# Patient Record
Sex: Male | Born: 1980 | Hispanic: Yes | Marital: Single | Smoking: Never smoker
Health system: Southern US, Community
[De-identification: ages and names within clinical notes are randomized; demographics above are authoritative.]

---

## 2010-05-06 ENCOUNTER — Emergency Department: Payer: Self-pay | Admitting: Emergency Medicine

## 2015-05-22 ENCOUNTER — Emergency Department: Payer: Self-pay

## 2015-05-22 ENCOUNTER — Emergency Department
Admission: EM | Admit: 2015-05-22 | Discharge: 2015-05-22 | Disposition: A | Discharge status: Home / Self Care | Payer: Self-pay | Attending: Emergency Medicine | Admitting: Emergency Medicine

## 2015-05-22 ENCOUNTER — Encounter: Payer: Self-pay | Admitting: *Deleted

## 2015-05-22 ENCOUNTER — Other Ambulatory Visit: Payer: Self-pay

## 2015-05-22 DIAGNOSIS — S20219A Contusion of unspecified front wall of thorax, initial encounter: Secondary | ICD-10-CM | POA: Insufficient documentation

## 2015-05-22 DIAGNOSIS — S161XXA Strain of muscle, fascia and tendon at neck level, initial encounter: Secondary | ICD-10-CM | POA: Insufficient documentation

## 2015-05-22 DIAGNOSIS — Y998 Other external cause status: Secondary | ICD-10-CM | POA: Insufficient documentation

## 2015-05-22 DIAGNOSIS — Y9389 Activity, other specified: Secondary | ICD-10-CM | POA: Insufficient documentation

## 2015-05-22 DIAGNOSIS — Y92410 Unspecified street and highway as the place of occurrence of the external cause: Secondary | ICD-10-CM | POA: Insufficient documentation

## 2015-05-22 MED ORDER — IBUPROFEN 800 MG PO TABS: 800.0000 mg | ORAL_TABLET | Freq: Three times a day (TID) | ORAL | Status: DC | PRN

## 2015-05-22 MED ORDER — OXYCODONE-ACETAMINOPHEN 5-325 MG PO TABS: 1.0000 | ORAL_TABLET | Freq: Four times a day (QID) | ORAL | Status: DC | PRN

## 2015-05-22 MED ORDER — IBUPROFEN 800 MG PO TABS
800.0000 mg | ORAL_TABLET | Freq: Once | ORAL | Status: AC
  Administered 2015-05-22: 800 mg via ORAL
  Filled 2015-05-22: qty 1

## 2015-05-22 MED ORDER — CYCLOBENZAPRINE HCL 10 MG PO TABS: 10.0000 mg | ORAL_TABLET | Freq: Three times a day (TID) | ORAL | Status: DC | PRN

## 2015-05-22 MED ORDER — OXYCODONE-ACETAMINOPHEN 5-325 MG PO TABS
1.0000 | ORAL_TABLET | Freq: Once | ORAL | Status: AC
  Administered 2015-05-22: 1 via ORAL
  Filled 2015-05-22: qty 1

## 2015-05-22 NOTE — ED Notes (Signed)
Patient transported to X-ray via wheelchair 
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
## 2018-09-04 ENCOUNTER — Emergency Department: Payer: Self-pay

## 2018-09-04 ENCOUNTER — Encounter: Payer: Self-pay | Admitting: Emergency Medicine

## 2018-09-04 ENCOUNTER — Other Ambulatory Visit: Payer: Self-pay

## 2018-09-04 ENCOUNTER — Emergency Department
Admission: EM | Admit: 2018-09-04 | Discharge: 2018-09-04 | Disposition: A | Discharge status: Home / Self Care | Payer: Self-pay | Attending: Emergency Medicine | Admitting: Emergency Medicine

## 2018-09-04 DIAGNOSIS — R109 Unspecified abdominal pain: Secondary | ICD-10-CM

## 2018-09-04 DIAGNOSIS — N201 Calculus of ureter: Secondary | ICD-10-CM | POA: Insufficient documentation

## 2018-09-04 LAB — CBC
HCT: 49.6 % (ref 39.0–52.0)
HEMOGLOBIN: 16.8 g/dL (ref 13.0–17.0)
MCH: 30.8 pg (ref 26.0–34.0)
MCHC: 33.9 g/dL (ref 30.0–36.0)
MCV: 91 fL (ref 80.0–100.0)
PLATELETS: 243 10*3/uL (ref 150–400)
RBC: 5.45 MIL/uL (ref 4.22–5.81)
RDW: 12.6 % (ref 11.5–15.5)
WBC: 6.7 10*3/uL (ref 4.0–10.5)
nRBC: 0 % (ref 0.0–0.2)

## 2018-09-04 LAB — URINALYSIS, COMPLETE (UACMP) WITH MICROSCOPIC
BACTERIA UA: NONE SEEN
Bilirubin Urine: NEGATIVE
KETONES UR: NEGATIVE mg/dL
LEUKOCYTES UA: NEGATIVE
NITRITE: NEGATIVE
Protein, ur: 100 mg/dL — AB
RBC / HPF: >50 RBC/hpf — ABNORMAL HIGH (ref 0–5)
Specific Gravity, Urine: 1.036 — ABNORMAL HIGH (ref 1.005–1.030)
Squamous Epithelial / LPF: NONE SEEN (ref 0–5)
pH: 5 (ref 5.0–8.0)

## 2018-09-04 LAB — BASIC METABOLIC PANEL
Anion gap: 7 (ref 5–15)
BUN: 15 mg/dL (ref 6–20)
CALCIUM: 8.9 mg/dL (ref 8.9–10.3)
CO2: 25 mmol/L (ref 22–32)
Chloride: 100 mmol/L (ref 98–111)
Creatinine, Ser: 0.7 mg/dL (ref 0.61–1.24)
GFR calc Af Amer: >60 mL/min (ref >60)
GFR calc non Af Amer: >60 mL/min (ref >60)
GLUCOSE: 240 mg/dL — AB (ref 70–99)
Potassium: 3.8 mmol/L (ref 3.5–5.1)
Sodium: 132 mmol/L — ABNORMAL LOW (ref 135–145)

## 2018-09-04 MED ORDER — NAPROXEN 500 MG PO TABS: 500.0000 mg | ORAL_TABLET | Freq: Two times a day (BID) | ORAL | 0 refills | Status: DC

## 2018-09-04 MED ORDER — TAMSULOSIN HCL 0.4 MG PO CAPS: 0.4000 mg | ORAL_CAPSULE | Freq: Every day | ORAL | 0 refills | Status: DC

## 2018-09-04 MED ORDER — TAMSULOSIN HCL 0.4 MG PO CAPS
0.4000 mg | ORAL_CAPSULE | ORAL | Status: AC
  Administered 2018-09-04: 0.4 mg via ORAL
  Filled 2018-09-04: qty 1

## 2018-09-04 MED ORDER — KETOROLAC TROMETHAMINE 60 MG/2ML IM SOLN
15.0000 mg | Freq: Once | INTRAMUSCULAR | Status: AC
  Administered 2018-09-04: 15 mg via INTRAMUSCULAR
  Filled 2018-09-04: qty 2

## 2018-09-04 MED ORDER — OXYCODONE-ACETAMINOPHEN 5-325 MG PO TABS: 1.0000 | ORAL_TABLET | Freq: Four times a day (QID) | ORAL | 0 refills | Status: AC | PRN

## 2018-09-04 NOTE — Discharge Instructions (Addendum)
Your CT scan shows 2 kidney stones that are in the left ureter just outside your bladder.  This is causing all of your symptoms.  These are small enough to pass on their own.  Take Flomax and naproxen to control the pain.  Percocet can be helpful for episodes of severe pain, but you should avoid taking this medicine when possible.

## 2018-09-04 NOTE — ED Provider Notes (Signed)
[facility] Emergency Department Provider Note  ____________________________________________  Time seen: Approximately 12:54 PM  I have reviewed the triage vital signs and the nursing notes.   HISTORY  Chief Complaint Back Pain    HPI Jesse Maynard is a 37 y.o. male with no significant past medical history who complains of left flank pain for the past 2 days, waxing and waning, no aggravating or alleviating factors, radiating around to his left lower quadrant and left testicle.  Denies any trauma.  No vomiting or fevers.  Pain is severe.      History reviewed. No pertinent past medical history.   There are no active problems to display for this patient.    History reviewed. No pertinent surgical history.   Prior to Admission medications   Medication Sig Start Date End Date Taking? Authorizing Provider  naproxen (NAPROSYN) 500 MG tablet Take 1 tablet (500 mg total) by mouth 2 (two) times daily with a meal. 09/04/18   Sharman CheekStafford, Rosibel Giacobbe, MD  oxyCODONE-acetaminophen (PERCOCET) 5-325 MG tablet Take 1 tablet by mouth every 6 (six) hours as needed for up to 2 days for severe pain. 09/04/18 09/06/18  Sharman CheekStafford, Evaluna Utke, MD  tamsulosin (FLOMAX) 0.4 MG CAPS capsule Take 1 capsule (0.4 mg total) by mouth daily. 09/04/18   Sharman CheekStafford, Golden Emile, MD     Allergies Patient has no known allergies.   No family history on file.  Social History Social History   Tobacco Use  . Smoking status: Never Smoker  . Smokeless tobacco: Never Used  Substance Use Topics  . Alcohol use: No  . Drug use: Not on file    Review of Systems  Constitutional:   No fever or chills.  ENT:   No sore throat. No rhinorrhea. Cardiovascular:   No chest pain or syncope. Respiratory:   No dyspnea or cough. Gastrointestinal:   Positive as above for left leg pain without vomiting and diarrhea.  Musculoskeletal:   Negative for focal pain or swelling All other systems reviewed  and are negative except as documented above in ROS and HPI.  ____________________________________________   PHYSICAL EXAM:  VITAL SIGNS: ED Triage Vitals  Enc Vitals Group     BP 09/04/18 0928 118/85     Pulse Rate 09/04/18 0928 66     Resp 09/04/18 0928 16     Temp 09/04/18 0928 97.9 F (36.6 C)     Temp Source 09/04/18 0928 Oral     SpO2 09/04/18 0928 94 %     Weight 09/04/18 0926 180 lb (81.6 kg)     Height 09/04/18 0926 5\' 6"  (1.676 m)     Head Circumference --      Peak Flow --      Pain Score 09/04/18 0926 9     Pain Loc --      Pain Edu? --      Excl. in GC? --     Vital signs reviewed, nursing assessments reviewed.   Constitutional:   Alert and oriented. Non-toxic appearance. Eyes:   Conjunctivae are normal. EOMI. PERRL. ENT      Head:   Normocephalic and atraumatic.      Nose:   No congestion/rhinnorhea.       Mouth/Throat:   MMM, no pharyngeal erythema. No peritonsillar mass.       Neck:   No meningismus. Full ROM. Hematological/Lymphatic/Immunilogical:   No cervical lymphadenopathy. Cardiovascular:   RRR. Symmetric bilateral radial and DP pulses.  No murmurs. Cap refill less  than 2 seconds. Respiratory:   Normal respiratory effort without tachypnea/retractions. Breath sounds are clear and equal bilaterally. No wheezes/rales/rhonchi. Gastrointestinal:   Soft with mild left lower quadrant tenderness. Non distended. There is mild left side CVA tenderness.  No rebound, rigidity, or guarding.  Musculoskeletal:   Normal range of motion in all extremities. No joint effusions.  No lower extremity tenderness.  No edema. Neurologic:   Normal speech and language.  Motor grossly intact. No acute focal neurologic deficits are appreciated.  Skin:    Skin is warm, dry and intact. No rash noted.  No petechiae, purpura, or bullae.  ____________________________________________    LABS (pertinent positives/negatives) (all labs ordered are listed, but only abnormal results  are displayed) Labs Reviewed  URINALYSIS, COMPLETE (UACMP) WITH MICROSCOPIC - Abnormal; Notable for the following components:      Result Value   Color, Urine AMBER (*)    APPearance CLOUDY (*)    Specific Gravity, Urine 1.036 (*)    Glucose, UA >=500 (*)    Hgb urine dipstick LARGE (*)    Protein, ur 100 (*)    RBC / HPF >50 (*)    All other components within normal limits  BASIC METABOLIC PANEL - Abnormal; Notable for the following components:   Sodium 132 (*)    Glucose, Bld 240 (*)    All other components within normal limits  CBC   ____________________________________________   EKG    ____________________________________________    RADIOLOGY  Ct Renal Stone Study  Result Date: 09/04/2018 CLINICAL DATA:  Left lower/mid back pain for 2 days. Hematuria. History of stones. EXAM: CT ABDOMEN AND PELVIS WITHOUT CONTRAST TECHNIQUE: Multidetector CT imaging of the abdomen and pelvis was performed following the standard protocol without IV contrast. COMPARISON:  May 06, 2010 FINDINGS: Lower chest: No acute abnormality. Hepatobiliary: No focal liver abnormality is seen. No gallstones, gallbladder wall thickening, or biliary dilatation. Pancreas: Unremarkable. No pancreatic ductal dilatation or surrounding inflammatory changes. Spleen: Normal in size without focal abnormality. Adrenals/Urinary Tract: Adrenal glands are normal. No right renal stones. A 3 mm stone is seen in the lower pole left kidney. No suspicious renal masses. No right-sided hydronephrosis. Mild left-sided hydronephrosis and minimal perinephric stranding noted. Mild left ureterectasis due to 2 small stones in the distal left ureter, both measuring approximately 3 or 4 mm. The right ureter demonstrates no stones or dilatation. The bladder is decompressed but unremarkable. Stomach/Bowel: Stomach and small bowel are normal. A few scattered colonic diverticuli are seen without diverticulitis. The appendix is normal.  Vascular/Lymphatic: No significant vascular findings are present. No enlarged abdominal or pelvic lymph nodes. Reproductive: Prostate is unremarkable. Other: No abdominal wall hernia or abnormality. No abdominopelvic ascites. Musculoskeletal: No acute or significant osseous findings. IMPRESSION: 1. Two stones are seen in the distal left ureter, measuring between 3 and 4 mm, resulting in mild hydronephrosis and minimal left perinephric stranding. This finding explains the patient's symptoms. 2. 3 mm stone in the lower pole the left kidney. 3. A few scattered colonic diverticuli are seen without diverticulitis. 4. No other acute abnormalities. Electronically Signed   By: Gerome Sam III M.D   On: 09/04/2018 11:24    ____________________________________________   PROCEDURES Procedures  ____________________________________________  DIFFERENTIAL DIAGNOSIS   Renal colic, ureteral obstruction, cystitis, diverticulitis.  Doubt torsion STI epididymitis hernia or abscess.  CLINICAL IMPRESSION / ASSESSMENT AND PLAN / ED COURSE  Pertinent labs & imaging results that were available during my care of the patient were  reviewed by me and considered in my medical decision making (see chart for details).    Patient presents with severe left flank pain.  Labs are normal except for microscopic hematuria on the urinalysis.  All consistent with renal colic.  He is never had kidney stones before, so CT scan was ordered to ensure that this is in fact kidney stones and that he does not have ureteral obstruction.  CT overall is reassuring, shows 2 small to medium sized stones without other complications.  Vital signs are normal.  Continue the patient on Flomax and NSAIDs, very low prescription of Percocet for episodes of severe pain.  I checked the controlled substance reporting system which has no records of this patient in the last 2 years.      ____________________________________________   FINAL CLINICAL  IMPRESSION(S) / ED DIAGNOSES    Final diagnoses:  Ureterolithiasis  Flank pain     ED Discharge Orders         Ordered    naproxen (NAPROSYN) 500 MG tablet  2 times daily with meals     09/04/18 1254    oxyCODONE-acetaminophen (PERCOCET) 5-325 MG tablet  Every 6 hours PRN     09/04/18 1254    tamsulosin (FLOMAX) 0.4 MG CAPS capsule  Daily     09/04/18 1254          Portions of this note were generated with dragon dictation software. Dictation errors may occur despite best attempts at proofreading.   Sharman Cheek, MD 09/04/18 1256

## 2018-09-04 NOTE — ED Notes (Signed)
Interpreter requested for assessment  

## 2018-09-04 NOTE — ED Triage Notes (Addendum)
C/O left lower/ mid back pain x 2 days.  Also c/o hematuria today.

## 2019-03-29 ENCOUNTER — Other Ambulatory Visit: Payer: Self-pay | Admitting: *Deleted

## 2019-03-29 DIAGNOSIS — Z20822 Contact with and (suspected) exposure to covid-19: Secondary | ICD-10-CM

## 2019-04-03 LAB — NOVEL CORONAVIRUS, NAA: SARS-CoV-2, NAA: NOT DETECTED

## 2019-04-21 ENCOUNTER — Encounter: Payer: Self-pay | Admitting: Emergency Medicine

## 2019-04-21 ENCOUNTER — Other Ambulatory Visit: Payer: Self-pay

## 2019-04-21 DIAGNOSIS — K219 Gastro-esophageal reflux disease without esophagitis: Secondary | ICD-10-CM | POA: Insufficient documentation

## 2019-04-21 LAB — LIPASE, BLOOD: Lipase: 39 U/L (ref 11–51)

## 2019-04-21 LAB — COMPREHENSIVE METABOLIC PANEL
ALT: 56 U/L — ABNORMAL HIGH (ref 0–44)
AST: 34 U/L (ref 15–41)
Albumin: 4.3 g/dL (ref 3.5–5.0)
Alkaline Phosphatase: 117 U/L (ref 38–126)
Anion gap: 10 (ref 5–15)
BUN: 12 mg/dL (ref 6–20)
CO2: 25 mmol/L (ref 22–32)
Calcium: 9.1 mg/dL (ref 8.9–10.3)
Chloride: 105 mmol/L (ref 98–111)
Creatinine, Ser: 0.75 mg/dL (ref 0.61–1.24)
GFR calc Af Amer: >60 mL/min (ref >60)
GFR calc non Af Amer: >60 mL/min (ref >60)
Glucose, Bld: 170 mg/dL — ABNORMAL HIGH (ref 70–99)
Potassium: 3.5 mmol/L (ref 3.5–5.1)
Sodium: 140 mmol/L (ref 135–145)
Total Bilirubin: 0.4 mg/dL (ref 0.3–1.2)
Total Protein: 8 g/dL (ref 6.5–8.1)

## 2019-04-21 LAB — CBC
HCT: 45.1 % (ref 39.0–52.0)
Hemoglobin: 15.6 g/dL (ref 13.0–17.0)
MCH: 30.6 pg (ref 26.0–34.0)
MCHC: 34.6 g/dL (ref 30.0–36.0)
MCV: 88.6 fL (ref 80.0–100.0)
Platelets: 365 10*3/uL (ref 150–400)
RBC: 5.09 MIL/uL (ref 4.22–5.81)
RDW: 12.1 % (ref 11.5–15.5)
WBC: 7.2 10*3/uL (ref 4.0–10.5)
nRBC: 0 % (ref 0.0–0.2)

## 2019-04-21 NOTE — ED Triage Notes (Signed)
Using video interpretor [employer]ylon [id number]. Patient with complaint of central upper abdominal pain that started about 2 hours ago. Patient states that he has also had nausea and vomiting.

## 2019-04-22 ENCOUNTER — Emergency Department
Admission: EM | Admit: 2019-04-22 | Discharge: 2019-04-22 | Disposition: A | Discharge status: Home / Self Care | Payer: Self-pay | Attending: Emergency Medicine | Admitting: Emergency Medicine

## 2019-04-22 ENCOUNTER — Other Ambulatory Visit: Payer: Self-pay

## 2019-04-22 ENCOUNTER — Emergency Department: Payer: Self-pay

## 2019-04-22 DIAGNOSIS — K219 Gastro-esophageal reflux disease without esophagitis: Secondary | ICD-10-CM

## 2019-04-22 LAB — TROPONIN I (HIGH SENSITIVITY)
Troponin I (High Sensitivity): <2 ng/L (ref <18)
Troponin I (High Sensitivity): <2 ng/L (ref <18)

## 2019-04-22 MED ORDER — FAMOTIDINE 40 MG PO TABS: 40.0000 mg | ORAL_TABLET | Freq: Every evening | ORAL | 1 refills | Status: AC

## 2019-04-22 MED ORDER — ALUM & MAG HYDROXIDE-SIMETH 200-200-20 MG/5ML PO SUSP
30.0000 mL | Freq: Once | ORAL | Status: AC
  Administered 2019-04-22: 30 mL via ORAL
  Filled 2019-04-22: qty 30

## 2019-04-22 MED ORDER — ONDANSETRON HCL 4 MG/2ML IJ SOLN
4.0000 mg | Freq: Once | INTRAMUSCULAR | Status: AC
  Administered 2019-04-22: 04:00:00 4 mg via INTRAVENOUS
  Filled 2019-04-22: qty 2

## 2019-04-22 MED ORDER — FAMOTIDINE IN NACL 20-0.9 MG/50ML-% IV SOLN
20.0000 mg | Freq: Once | INTRAVENOUS | Status: AC
  Administered 2019-04-22: 04:00:00 20 mg via INTRAVENOUS
  Filled 2019-04-22: qty 50

## 2019-04-22 MED ORDER — LIDOCAINE VISCOUS HCL 2 % MT SOLN
15.0000 mL | Freq: Once | OROMUCOSAL | Status: AC
  Administered 2019-04-22: 04:00:00 15 mL via ORAL
  Filled 2019-04-22: qty 15

## 2019-04-22 NOTE — ED Notes (Signed)
Peripheral IV discontinued. Catheter intact. No signs of infiltration or redness. Gauze applied to IV site.  Discharge instructions reviewed with patient. Questions fielded by this RN. Patient verbalizes understanding of instructions. Patient discharged home in stable condition per veronese. No acute distress noted at time of discharge.   

## 2019-04-22 NOTE — ED Provider Notes (Signed)
[facility] Emergency Department Provider Note  ____________________________________________  Time seen: Approximately 2:15 AM  I have reviewed the triage vital signs and the nursing notes.   HISTORY  Chief Complaint Abdominal Pain and Emesis   HPI Jesse Maynard is a 38 y.o. male with no significant past medical history who presents for evaluation of abdominal pain.  Patient reports that the pain started several minutes after eating fried chicken.  The pain was burning, located in his epigastric region, constant, severe, and radiating to his back.  Patient had nausea and one episode of nonbloody nonbilious emesis.  He reports taking 1 naproxen with improvement of his pain.  Currently the pain is mild.  No chest pain, no shortness of breath, no dizziness, no fever, no cough, no diarrhea, no dysuria.  Patient reports 1 prior episode of similar pain 3 months ago which resolved with no intervention.  He denies any alcohol use.  Denies NSAID use other than the dose that he took this evening.  Denies any known history of peptic ulcer disease or gastritis.  Denies any personal or family history of heart attacks.  He is not a smoker.    PMH None  Prior to Admission medications   Medication Sig Start Date End Date Taking? Authorizing Provider  famotidine (PEPCID) 40 MG tablet Take 1 tablet (40 mg total) by mouth every evening. 04/22/19 04/21/20  Nita SickleVeronese, Byron, MD  naproxen (NAPROSYN) 500 MG tablet Take 1 tablet (500 mg total) by mouth 2 (two) times daily with a meal. 09/04/18   Sharman CheekStafford, Phillip, MD  tamsulosin (FLOMAX) 0.4 MG CAPS capsule Take 1 capsule (0.4 mg total) by mouth daily. 09/04/18   Sharman CheekStafford, Phillip, MD    Allergies Patient has no known allergies.  No family history on file.  Social History Social History   Tobacco Use  . Smoking status: Never Smoker  . Smokeless tobacco: Never Used  Substance Use Topics  . Alcohol use: No  . Drug  use: Never    Review of Systems  Constitutional: Negative for fever. Eyes: Negative for visual changes. ENT: Negative for sore throat. Neck: No neck pain  Cardiovascular: Negative for chest pain. Respiratory: Negative for shortness of breath. Gastrointestinal: + epigastric abdominal pain, nausea, and vomiting. No diarrhea. Genitourinary: Negative for dysuria. Musculoskeletal: Negative for back pain. Skin: Negative for rash. Neurological: Negative for headaches, weakness or numbness. Psych: No SI or HI  ____________________________________________   PHYSICAL EXAM:  VITAL SIGNS: ED Triage Vitals [04/21/19 2304]  Enc Vitals Group     BP 124/78     Pulse Rate (!) 58     Resp 18     Temp 98.6 F (37 C)     Temp Source Oral     SpO2 100 %     Weight 178 lb 9.2 oz (81 kg)     Height 5\' 6"  (1.676 m)     Head Circumference      Peak Flow      Pain Score 10     Pain Loc      Pain Edu?      Excl. in GC?     Constitutional: Alert and oriented. Well appearing and in no apparent distress. HEENT:      Head: Normocephalic and atraumatic.         Eyes: Conjunctivae are normal. Sclera is non-icteric.       Mouth/Throat: Mucous membranes are moist.       Neck: Supple with  no signs of meningismus. Cardiovascular: Regular rate and rhythm. No murmurs, gallops, or rubs. 2+ symmetrical distal pulses are present in all extremities. No JVD. Respiratory: Normal respiratory effort. Lungs are clear to auscultation bilaterally. No wheezes, crackles, or rhonchi.  Gastrointestinal: Soft, tenderness palpation in the epigastric region, and non distended with positive bowel sounds. No rebound or guarding. Genitourinary: No CVA tenderness. Musculoskeletal: Nontender with normal range of motion in all extremities. No edema, cyanosis, or erythema of extremities. Neurologic: Normal speech and language. Face is symmetric. Moving all extremities. No gross focal neurologic deficits are appreciated.  Skin: Skin is warm, dry and intact. No rash noted. Psychiatric: Mood and affect are normal. Speech and behavior are normal.  ____________________________________________   LABS (all labs ordered are listed, but only abnormal results are displayed)  Labs Reviewed  COMPREHENSIVE METABOLIC PANEL - Abnormal; Notable for the following components:      Result Value   Glucose, Bld 170 (*)    ALT 56 (*)    All other components within normal limits  LIPASE, BLOOD  CBC  URINALYSIS, COMPLETE (UACMP) WITH MICROSCOPIC  TROPONIN I (HIGH SENSITIVITY)  TROPONIN I (HIGH SENSITIVITY)   ____________________________________________  EKG  ED ECG REPORT I, Nita Sicklearolina Delonta Yohannes, the attending physician, personally viewed and interpreted this ECG.  Sinus bradycardia, rate of 56, normal intervals, normal axis, no ST elevations or depressions, inferior and lateral Q waves. New when compared to prior from 2016.  03:55 -sinus bradycardia, normal intervals, normal axis, no ST elevations or depressions, persistent inferior and lateral Q waves.  Unchanged from prior. ____________________________________________  RADIOLOGY  I have personally reviewed the images performed during this visit and I agree with the Radiologist's read.   Interpretation by Radiologist:  Dg Abdomen Acute W/chest  Result Date: 04/22/2019 CLINICAL DATA:  Epigastric and abdominal pain EXAM: DG ABDOMEN ACUTE W/ 1V CHEST COMPARISON:  None. FINDINGS: There are coarse lung markings bilaterally with possible more focal opacities in the right mid lung zone and in the retrocardiac region. The bowel gas pattern is nonspecific and nonobstructive. There is a moderate amount of stool ascending colon. There is no acute osseous abnormality. IMPRESSION: 1. Nonobstructive bowel gas pattern. 2. Coarsened lung markings bilaterally with possible more focal opacities in the retrocardiac region in the right mid lung zone. Findings are nonspecific and may be  secondary to an atypical infectious process versus less likely pulmonary edema. A systemic chronic granulomatous disease such as sarcoidosis could have a similar appearance, however there is no definite lymphadenopathy identified on this exam. Electronically Signed   By: Katherine Mantlehristopher  Green M.D.   On: 04/22/2019 03:29      ____________________________________________   PROCEDURES  Procedure(s) performed: None Procedures Critical Care performed:  None ____________________________________________   INITIAL IMPRESSION / ASSESSMENT AND PLAN / ED COURSE  38 y.o. male with no significant past medical history who presents for evaluation of epigastric burning abdominal pain associated with nausea and vomiting after eating fried chicken. Ddx GERD, gastritis, PUD, pancreatitis, GB disease. Low suspicion for ACS. HEART score of 0. HS troponin x 2 to further stratify, negative. EKG showing no acute ischemic changes. Labs showing normal CBC, CMP, lipase. XR concerning for coarsened lung markings.  Patient has no signs or symptoms of pneumonia.  Recommended close follow-up with his doctor for repeat x-ray.  Patient given GI cocktail, pepcid, zofran and feels markedly improved with resolution of symptoms. Will dc home on pepcid and f/u with PCP. Discussed my standard return precautions.  As part of my medical decision making, I reviewed the following data within the [place] notes reviewed and incorporated, Labs reviewed , EKG interpreted , Old EKG reviewed, Old chart reviewed, Radiograph reviewed , Notes from prior ED visits and [place] Controlled Substance Database   Patient was evaluated in Emergency Department today for the symptoms described in the history of present illness. Patient was evaluated in the context of the global COVID-19 pandemic, which necessitated consideration that the patient might be at risk for infection with the SARS-CoV-2 virus that causes COVID-19.  Institutional protocols and algorithms that pertain to the evaluation of patients at risk for COVID-19 are in a state of rapid change based on information released by regulatory bodies including the CDC and federal and state organizations. These policies and algorithms were followed during the patient's care in the ED.   ____________________________________________   FINAL CLINICAL IMPRESSION(S) / ED DIAGNOSES   Final diagnoses:  Gastroesophageal reflux disease, esophagitis presence not specified      NEW MEDICATIONS STARTED DURING THIS VISIT:  ED Discharge Orders         Ordered    famotidine (PEPCID) 40 MG tablet  Every evening     04/22/19 0410           Note:  This document was prepared using Dragon voice recognition software and may include unintentional dictation errors.    Alfred Levins, [place], MD 04/22/19 [id number]

## 2019-04-28 ENCOUNTER — Other Ambulatory Visit: Payer: Self-pay

## 2019-04-28 ENCOUNTER — Encounter: Payer: Self-pay | Admitting: Emergency Medicine

## 2019-04-28 ENCOUNTER — Encounter
Admission: EM | Disposition: A | Discharge status: Home / Self Care | Payer: Self-pay | Source: Home / Self Care | Attending: General Surgery

## 2019-04-28 ENCOUNTER — Inpatient Hospital Stay: Payer: Self-pay | Admitting: Anesthesiology

## 2019-04-28 ENCOUNTER — Emergency Department: Payer: Self-pay

## 2019-04-28 ENCOUNTER — Inpatient Hospital Stay
Admission: EM | Admit: 2019-04-28 | Discharge: 2019-04-29 | DRG: 419 | Disposition: A | Discharge status: Home / Self Care | Payer: Self-pay | Attending: General Surgery | Admitting: General Surgery

## 2019-04-28 DIAGNOSIS — K2970 Gastritis, unspecified, without bleeding: Secondary | ICD-10-CM | POA: Diagnosis present

## 2019-04-28 DIAGNOSIS — K828 Other specified diseases of gallbladder: Secondary | ICD-10-CM | POA: Diagnosis present

## 2019-04-28 DIAGNOSIS — K810 Acute cholecystitis: Principal | ICD-10-CM

## 2019-04-28 DIAGNOSIS — K660 Peritoneal adhesions (postprocedural) (postinfection): Secondary | ICD-10-CM | POA: Diagnosis present

## 2019-04-28 DIAGNOSIS — R109 Unspecified abdominal pain: Secondary | ICD-10-CM

## 2019-04-28 DIAGNOSIS — Z20828 Contact with and (suspected) exposure to other viral communicable diseases: Secondary | ICD-10-CM | POA: Diagnosis present

## 2019-04-28 HISTORY — PX: CHOLECYSTECTOMY: SHX55

## 2019-04-28 LAB — COMPREHENSIVE METABOLIC PANEL
ALT: 43 U/L (ref 0–44)
AST: 33 U/L (ref 15–41)
Albumin: 4.4 g/dL (ref 3.5–5.0)
Alkaline Phosphatase: 84 U/L (ref 38–126)
Anion gap: 11 (ref 5–15)
BUN: 13 mg/dL (ref 6–20)
CO2: 25 mmol/L (ref 22–32)
Calcium: 8.9 mg/dL (ref 8.9–10.3)
Chloride: 102 mmol/L (ref 98–111)
Creatinine, Ser: 0.76 mg/dL (ref 0.61–1.24)
GFR calc Af Amer: >60 mL/min (ref >60)
GFR calc non Af Amer: >60 mL/min (ref >60)
Glucose, Bld: 132 mg/dL — ABNORMAL HIGH (ref 70–99)
Potassium: 3.6 mmol/L (ref 3.5–5.1)
Sodium: 138 mmol/L (ref 135–145)
Total Bilirubin: 1 mg/dL (ref 0.3–1.2)
Total Protein: 8 g/dL (ref 6.5–8.1)

## 2019-04-28 LAB — CBC
HCT: 44.2 % (ref 39.0–52.0)
Hemoglobin: 15.1 g/dL (ref 13.0–17.0)
MCH: 30.4 pg (ref 26.0–34.0)
MCHC: 34.2 g/dL (ref 30.0–36.0)
MCV: 89.1 fL (ref 80.0–100.0)
Platelets: 333 10*3/uL (ref 150–400)
RBC: 4.96 MIL/uL (ref 4.22–5.81)
RDW: 12.3 % (ref 11.5–15.5)
WBC: 11.1 10*3/uL — ABNORMAL HIGH (ref 4.0–10.5)
nRBC: 0 % (ref 0.0–0.2)

## 2019-04-28 LAB — URINALYSIS, COMPLETE (UACMP) WITH MICROSCOPIC
Bilirubin Urine: NEGATIVE
Glucose, UA: NEGATIVE mg/dL
Hgb urine dipstick: NEGATIVE
Ketones, ur: 80 mg/dL — AB
Leukocytes,Ua: NEGATIVE
Nitrite: NEGATIVE
Protein, ur: 30 mg/dL — AB
Specific Gravity, Urine: 1.031 — ABNORMAL HIGH (ref 1.005–1.030)
Squamous Epithelial / HPF: NONE SEEN (ref 0–5)
pH: 6 (ref 5.0–8.0)

## 2019-04-28 LAB — SARS CORONAVIRUS 2 BY RT PCR (HOSPITAL ORDER, PERFORMED IN ~~LOC~~ HOSPITAL LAB): SARS Coronavirus 2: NEGATIVE

## 2019-04-28 LAB — LIPASE, BLOOD: Lipase: 28 U/L (ref 11–51)

## 2019-04-28 SURGERY — LAPAROSCOPIC CHOLECYSTECTOMY
Anesthesia: General

## 2019-04-28 MED ORDER — IBUPROFEN 400 MG PO TABS: 600.0000 mg | ORAL_TABLET | Freq: Four times a day (QID) | ORAL | Status: DC | PRN

## 2019-04-28 MED ORDER — KETOROLAC TROMETHAMINE 30 MG/ML IJ SOLN
15.0000 mg | Freq: Once | INTRAMUSCULAR | Status: AC
  Administered 2019-04-28: 03:00:00 15 mg via INTRAVENOUS
  Filled 2019-04-28: qty 1

## 2019-04-28 MED ORDER — FENTANYL CITRATE (PF) 100 MCG/2ML IJ SOLN
INTRAMUSCULAR | Status: AC
  Filled 2019-04-28: qty 2

## 2019-04-28 MED ORDER — LACTATED RINGERS IV SOLN
INTRAVENOUS | Status: DC | PRN
  Administered 2019-04-28: 17:00:00 via INTRAVENOUS

## 2019-04-28 MED ORDER — ONDANSETRON HCL 4 MG/2ML IJ SOLN: 4.0000 mg | Freq: Four times a day (QID) | INTRAMUSCULAR | Status: DC | PRN

## 2019-04-28 MED ORDER — DEXTROSE IN LACTATED RINGERS 5 % IV SOLN
INTRAVENOUS | Status: DC
  Administered 2019-04-28 (×2): via INTRAVENOUS

## 2019-04-28 MED ORDER — HYDROMORPHONE HCL 1 MG/ML IJ SOLN
INTRAMUSCULAR | Status: DC | PRN
  Administered 2019-04-28 (×3): 0.5 mg via INTRAVENOUS

## 2019-04-28 MED ORDER — EVICEL 5 ML EX KIT
PACK | CUTANEOUS | Status: DC | PRN
  Administered 2019-04-28: 1
  Administered 2019-04-28: 5 mL

## 2019-04-28 MED ORDER — HYDROMORPHONE HCL 1 MG/ML IJ SOLN
0.5000 mg | INTRAMUSCULAR | Status: DC | PRN
  Administered 2019-04-28: 13:00:00 0.5 mg via INTRAVENOUS
  Filled 2019-04-28: qty 1

## 2019-04-28 MED ORDER — FENTANYL CITRATE (PF) 100 MCG/2ML IJ SOLN: 25.0000 ug | INTRAMUSCULAR | Status: DC | PRN

## 2019-04-28 MED ORDER — PROPOFOL 10 MG/ML IV BOLUS
INTRAVENOUS | Status: DC | PRN
  Administered 2019-04-28: 170 mg via INTRAVENOUS

## 2019-04-28 MED ORDER — PIPERACILLIN-TAZOBACTAM 3.375 G IVPB
3.3750 g | Freq: Three times a day (TID) | INTRAVENOUS | Status: DC
  Administered 2019-04-28 – 2019-04-29 (×4): 3.375 g via INTRAVENOUS
  Filled 2019-04-28 (×4): qty 50

## 2019-04-28 MED ORDER — ACETAMINOPHEN 500 MG PO TABS
1000.0000 mg | ORAL_TABLET | Freq: Four times a day (QID) | ORAL | Status: DC
  Administered 2019-04-28 – 2019-04-29 (×3): 1000 mg via ORAL
  Filled 2019-04-28 (×3): qty 2

## 2019-04-28 MED ORDER — LIDOCAINE-EPINEPHRINE 1 %-1:100000 IJ SOLN
INTRAMUSCULAR | Status: DC | PRN
  Administered 2019-04-28: 10 mL via SUBCUTANEOUS

## 2019-04-28 MED ORDER — KETOROLAC TROMETHAMINE 30 MG/ML IJ SOLN
30.0000 mg | Freq: Four times a day (QID) | INTRAMUSCULAR | Status: DC | PRN
  Administered 2019-04-28: 09:00:00 30 mg via INTRAVENOUS
  Filled 2019-04-28: qty 1

## 2019-04-28 MED ORDER — FENTANYL CITRATE (PF) 100 MCG/2ML IJ SOLN
50.0000 ug | Freq: Once | INTRAMUSCULAR | Status: AC
  Administered 2019-04-28: 16:00:00 50 ug via INTRAVENOUS

## 2019-04-28 MED ORDER — TAMSULOSIN HCL 0.4 MG PO CAPS
0.4000 mg | ORAL_CAPSULE | Freq: Every day | ORAL | Status: DC
  Administered 2019-04-29: 10:00:00 0.4 mg via ORAL
  Filled 2019-04-28: qty 1

## 2019-04-28 MED ORDER — MORPHINE SULFATE (PF) 4 MG/ML IV SOLN
4.0000 mg | Freq: Once | INTRAVENOUS | Status: AC
  Administered 2019-04-28: 4 mg via INTRAVENOUS
  Filled 2019-04-28: qty 1

## 2019-04-28 MED ORDER — MIDAZOLAM HCL 2 MG/2ML IJ SOLN
INTRAMUSCULAR | Status: DC | PRN
  Administered 2019-04-28: 2 mg via INTRAVENOUS

## 2019-04-28 MED ORDER — FENTANYL CITRATE (PF) 100 MCG/2ML IJ SOLN
INTRAMUSCULAR | Status: DC | PRN
  Administered 2019-04-28 (×2): 50 ug via INTRAVENOUS

## 2019-04-28 MED ORDER — SUGAMMADEX SODIUM 200 MG/2ML IV SOLN
INTRAVENOUS | Status: DC | PRN
  Administered 2019-04-28: 150 mg via INTRAVENOUS

## 2019-04-28 MED ORDER — FAMOTIDINE 20 MG PO TABS: 40.0000 mg | ORAL_TABLET | Freq: Every evening | ORAL | Status: DC

## 2019-04-28 MED ORDER — PIPERACILLIN-TAZOBACTAM 3.375 G IVPB 30 MIN
3.3750 g | Freq: Once | INTRAVENOUS | Status: AC
  Administered 2019-04-28: 3.375 g via INTRAVENOUS

## 2019-04-28 MED ORDER — KETOROLAC TROMETHAMINE 30 MG/ML IJ SOLN
30.0000 mg | Freq: Four times a day (QID) | INTRAMUSCULAR | Status: DC
  Administered 2019-04-28 – 2019-04-29 (×3): 30 mg via INTRAVENOUS
  Filled 2019-04-28 (×3): qty 1

## 2019-04-28 MED ORDER — DEXAMETHASONE SODIUM PHOSPHATE 10 MG/ML IJ SOLN
INTRAMUSCULAR | Status: DC | PRN
  Administered 2019-04-28: 4 mg via INTRAVENOUS

## 2019-04-28 MED ORDER — ONDANSETRON HCL 4 MG/2ML IJ SOLN
4.0000 mg | INTRAMUSCULAR | Status: AC
  Administered 2019-04-28: 03:00:00 4 mg via INTRAVENOUS
  Filled 2019-04-28: qty 2

## 2019-04-28 MED ORDER — ROCURONIUM BROMIDE 100 MG/10ML IV SOLN
INTRAVENOUS | Status: DC | PRN
  Administered 2019-04-28: 20 mg via INTRAVENOUS
  Administered 2019-04-28: 30 mg via INTRAVENOUS
  Administered 2019-04-28: 40 mg via INTRAVENOUS
  Administered 2019-04-28: 10 mg via INTRAVENOUS

## 2019-04-28 MED ORDER — ONDANSETRON HCL 4 MG/2ML IJ SOLN
INTRAMUSCULAR | Status: DC | PRN
  Administered 2019-04-28: 4 mg via INTRAVENOUS

## 2019-04-28 MED ORDER — ONDANSETRON HCL 4 MG/2ML IJ SOLN: 4.0000 mg | Freq: Once | INTRAMUSCULAR | Status: DC | PRN

## 2019-04-28 MED ORDER — ONDANSETRON 4 MG PO TBDP
4.0000 mg | ORAL_TABLET | Freq: Four times a day (QID) | ORAL | Status: DC | PRN
  Filled 2019-04-28: qty 1

## 2019-04-28 MED ORDER — SODIUM CHLORIDE FLUSH 0.9 % IV SOLN
INTRAVENOUS | Status: AC
  Filled 2019-04-28: qty 10

## 2019-04-28 MED ORDER — LIDOCAINE HCL (CARDIAC) PF 100 MG/5ML IV SOSY
PREFILLED_SYRINGE | INTRAVENOUS | Status: DC | PRN
  Administered 2019-04-28: 80 mg via INTRAVENOUS

## 2019-04-28 SURGICAL SUPPLY — 49 items
APPLIER CLIP 5 13 M/L LIGAMAX5 (MISCELLANEOUS) ×2
BLADE SURG SZ11 CARB STEEL (BLADE) ×2 IMPLANT
CANISTER SUCT 1200ML W/VALVE (MISCELLANEOUS) ×2 IMPLANT
CHLORAPREP W/TINT 26 (MISCELLANEOUS) ×2 IMPLANT
CLIP APPLIE 5 13 M/L LIGAMAX5 (MISCELLANEOUS) ×1 IMPLANT
COVER WAND RF STERILE (DRAPES) ×2 IMPLANT
DECANTER SPIKE VIAL GLASS SM (MISCELLANEOUS) ×2 IMPLANT
DEFOGGER SCOPE WARMER CLEARIFY (MISCELLANEOUS) ×2 IMPLANT
DERMABOND ADVANCED (GAUZE/BANDAGES/DRESSINGS) ×1
DERMABOND ADVANCED .7 DNX12 (GAUZE/BANDAGES/DRESSINGS) ×1 IMPLANT
DISSECTOR KITTNER STICK (MISCELLANEOUS) ×1 IMPLANT
DISSECTORS/KITTNER STICK (MISCELLANEOUS) ×2
ELECT CAUTERY BLADE TIP 2.5 (TIP) ×2
ELECT REM PT RETURN 9FT ADLT (ELECTROSURGICAL) ×2
ELECTRODE CAUTERY BLDE TIP 2.5 (TIP) ×1 IMPLANT
ELECTRODE REM PT RTRN 9FT ADLT (ELECTROSURGICAL) ×1 IMPLANT
EVICEL AIRLESS SPRAY ACCES (MISCELLANEOUS) ×2 IMPLANT
GLOVE BIO SURGEON STRL SZ 6.5 (GLOVE) ×2 IMPLANT
GLOVE INDICATOR 7.0 STRL GRN (GLOVE) ×12 IMPLANT
GOWN STRL REUS W/ TWL LRG LVL3 (GOWN DISPOSABLE) ×3 IMPLANT
GOWN STRL REUS W/TWL LRG LVL3 (GOWN DISPOSABLE) ×3
GRASPER SUT TROCAR 14GX15 (MISCELLANEOUS) ×2 IMPLANT
HEMOSTAT SURGICEL 2X3 (HEMOSTASIS) ×8 IMPLANT
IRRIGATION STRYKERFLOW (MISCELLANEOUS) ×1 IMPLANT
IRRIGATOR STRYKERFLOW (MISCELLANEOUS) ×2
IV NS 1000ML (IV SOLUTION) ×1
IV NS 1000ML BAXH (IV SOLUTION) ×1 IMPLANT
KIT TURNOVER KIT A (KITS) ×2 IMPLANT
LABEL OR SOLS (LABEL) IMPLANT
NEEDLE HYPO 22GX1.5 SAFETY (NEEDLE) ×2 IMPLANT
NS IRRIG 500ML POUR BTL (IV SOLUTION) ×2 IMPLANT
PACK LAP CHOLECYSTECTOMY (MISCELLANEOUS) ×2 IMPLANT
PENCIL ELECTRO HAND CTR (MISCELLANEOUS) ×2 IMPLANT
POUCH SPECIMEN RETRIEVAL 10MM (ENDOMECHANICALS) ×2 IMPLANT
SCISSORS METZENBAUM CVD 33 (INSTRUMENTS) ×2 IMPLANT
SET TUBE SMOKE EVAC HIGH FLOW (TUBING) ×2 IMPLANT
SLEEVE ADV FIXATION 5X100MM (TROCAR) ×4 IMPLANT
SOLUTION ELECTROLUBE (MISCELLANEOUS) IMPLANT
STRIP CLOSURE SKIN 1/2X4 (GAUZE/BANDAGES/DRESSINGS) ×2 IMPLANT
SUT MNCRL 4-0 (SUTURE) ×1
SUT MNCRL 4-0 27XMFL (SUTURE) ×1
SUT VIC AB 3-0 SH 27 (SUTURE) ×1
SUT VIC AB 3-0 SH 27X BRD (SUTURE) ×1 IMPLANT
SUT VICRYL 0 AB UR-6 (SUTURE) ×4 IMPLANT
SUTURE MNCRL 4-0 27XMF (SUTURE) ×1 IMPLANT
TIP RIGID 35CM EVICEL (HEMOSTASIS) ×2 IMPLANT
TROCAR ADV FIXATION 12X100MM (TROCAR) ×2 IMPLANT
TROCAR Z-THREAD OPTICAL 5X100M (TROCAR) ×2 IMPLANT
WATER STERILE IRR 1000ML POUR (IV SOLUTION) IMPLANT

## 2019-04-28 NOTE — ED Notes (Signed)
ED TO INPATIENT HANDOFF REPORT  ED Nurse Name and Phone #: Fritzi Mandes [phone]  S Name/Age/Gender Jesse Maynard 38 y.o. male Room/Bed: ED25A/ED25A  Code Status   Code Status: Full Code  Home/SNF/Other Home Patient oriented to: self, place, time and situation Is this baseline? Yes   Triage Complete: Triage complete  Chief Complaint abd pain  Triage Note Using video interpretor [place]. Patient with complaint of epigastric pain that woke him out of his sleep about 3 hours ago. Patient denies fever, shortness of breath, nausea or vomiting. Patient states that he has had some pain since Wednesday.    Allergies No Known Allergies  Level of Care/Admitting Diagnosis ED Disposition    ED Disposition Condition [facility] Area: [place] [[id number]]  Level of Care: Med-Surg [16]  Covid Evaluation: Asymptomatic Screening Protocol (No Symptoms)  Diagnosis: Cholecystitis, acute [867672]  Admitting Physician: Darreld Mclean  Attending Physician: Darreld Mclean  Estimated length of stay: past midnight tomorrow  Certification:: I certify this patient will need inpatient services for at least 2 midnights  Bed request comments: 2c, if available  PT Class (Do Not Modify): Inpatient [101]  PT Acc Code (Do Not Modify): Private [1]       B Medical/Surgery History History reviewed. No pertinent past medical history. History reviewed. No pertinent surgical history.   A IV Location/Drains/Wounds Patient Lines/Drains/Airways Status   Active Line/Drains/Airways    Name:   Placement date:   Placement time:   Site:   Days:   Peripheral IV 04/28/19 Right Antecubital   04/28/19    0220    Antecubital   less than 1          Intake/Output Last 24 hours No intake or output data in the 24 hours ending 04/28/19 0947  Labs/Imaging Results for orders placed or performed during the hospital encounter of 04/28/19 (from  the past 48 hour(s))  Lipase, blood     Status: None   Collection Time: 04/28/19  2:21 AM  Result Value Ref Range   Lipase 28 11 - 51 U/L    Comment: Performed at [facility], [address] [place], [place] [address]  Comprehensive metabolic panel     Status: Abnormal   Collection Time: 04/28/19  2:21 AM  Result Value Ref Range   Sodium 138 135 - 145 mmol/L   Potassium 3.6 3.5 - 5.1 mmol/L   Chloride 102 98 - 111 mmol/L   CO2 25 22 - 32 mmol/L   Glucose, Bld 132 (H) 70 - 99 mg/dL   BUN 13 6 - 20 mg/dL   Creatinine, Ser 0.76 0.61 - 1.24 mg/dL   Calcium 8.9 8.9 - 10.3 mg/dL   Total Protein 8.0 6.5 - 8.1 g/dL   Albumin 4.4 3.5 - 5.0 g/dL   AST 33 15 - 41 U/L   ALT 43 0 - 44 U/L   Alkaline Phosphatase 84 38 - 126 U/L   Total Bilirubin 1.0 0.3 - 1.2 mg/dL   GFR calc non Af Amer >60 >60 mL/min   GFR calc Af Amer >60 >60 mL/min   Anion gap 11 5 - 15    Comment: Performed at [facility], [address] [place], [place] [address]  CBC     Status: Abnormal   Collection Time: 04/28/19  2:21 AM  Result Value Ref Range   WBC 11.1 (H) 4.0 - 10.5 K/uL   RBC 4.96 4.22 -  5.81 MIL/uL   Hemoglobin 15.1 13.0 - 17.0 g/dL   HCT 16.144.2 09.639.0 - 04.552.0 %   MCV 89.1 80.0 - 100.0 fL   MCH 30.4 26.0 - 34.0 pg   MCHC 34.2 30.0 - 36.0 g/dL   RDW 40.912.3 81.111.5 - 91.415.5 %   Platelets 333 150 - 400 K/uL   nRBC 0.0 0.0 - 0.2 %    Comment: Performed at [facility], [address] [place], [place] [address]   [place] Abdomen Limited Ruq  Result Date: 04/28/2019 CLINICAL DATA:  Epigastric abdominal pain and nausea. EXAM: ULTRASOUND ABDOMEN LIMITED RIGHT UPPER QUADRANT COMPARISON:  09/04/2018 CT abdomen/pelvis FINDINGS: Gallbladder: Gallbladder nearly filled with layering sludge. No shadowing gallstones demonstrated. Borderline mild diffuse gallbladder wall thickening. Trace pericholecystic fluid. Sonographic Eulah PontMurphy sign is present. Common bile duct: Diameter: 5 mm Liver: No focal lesion  identified. Within normal limits in parenchymal echogenicity. Portal vein is patent on color Doppler imaging with normal direction of blood flow towards the liver. Other: None. IMPRESSION: 1. Gallbladder nearly filled with sludge. No cholelithiasis detected. Mild diffuse gallbladder wall thickening. Trace pericholecystic fluid. Sonographic Murphy sign present. Acute cholecystitis cannot be excluded. 2. No biliary ductal dilatation. 3. Normal liver. Electronically Signed   By: Delbert PhenixJason A Poff M.D.   On: 04/28/2019 04:32    Pending Labs Unresulted Labs (From admission, onward)    Start     Ordered   04/29/19 0500  Comprehensive metabolic panel  Tomorrow morning,   STAT     04/28/19 0510   04/29/19 0500  Magnesium  Tomorrow morning,   STAT     04/28/19 0510   04/29/19 0500  Phosphorus  Tomorrow morning,   STAT     04/28/19 0510   04/29/19 0500  CBC  Tomorrow morning,   STAT     04/28/19 0510   04/28/19 0507  SARS Coronavirus 2 [facility](Hospital order, Performed in [facility] hospital lab) Nasopharyngeal Nasopharyngeal Swab  (Symptomatic/High Risk of Exposure/Tier 1 Patients Labs with Precautions)  ONCE - STAT,   STAT    Question Answer Comment  Is this test for diagnosis or screening Screening   Symptomatic for COVID-19 as defined by CDC No   Hospitalized for COVID-19 No   Admitted to ICU for COVID-19 No   Previously tested for COVID-19 No   Resident in a congregate (group) care setting No   Employed in healthcare setting No      04/28/19 0507   04/28/19 0507  HIV antibody (Routine Testing)  Once,   STAT     04/28/19 0510   04/28/19 0215  Urinalysis, Complete w Microscopic  ONCE - STAT,   STAT     04/28/19 0215          Vitals/Pain Today's Vitals   04/28/19 0310 04/28/19 0324 04/28/19 0340 04/28/19 0416  BP:  (!) 157/82  116/64  Pulse:  (!) 57  (!) 58  Resp:      Temp:      TempSrc:      SpO2:  100%  97%  Weight:      Height:      PainSc: 9   9      Isolation Precautions No active  isolations  Medications Medications  piperacillin-tazobactam (ZOSYN) IVPB 3.375 g (has no administration in time range)  dextrose 5 % in lactated ringers infusion (has no administration in time range)  piperacillin-tazobactam (ZOSYN) IVPB 3.375 g (has no administration in time range)  acetaminophen (TYLENOL) tablet  1,000 mg (has no administration in time range)  ketorolac (TORADOL) 30 MG/ML injection 30 mg (has no administration in time range)  ibuprofen (ADVIL) tablet 600 mg (has no administration in time range)  HYDROmorphone (DILAUDID) injection 0.5 mg (has no administration in time range)  ondansetron (ZOFRAN-ODT) disintegrating tablet 4 mg (has no administration in time range)    Or  ondansetron (ZOFRAN) injection 4 mg (has no administration in time range)  famotidine (PEPCID) tablet 40 mg (has no administration in time range)  tamsulosin (FLOMAX) capsule 0.4 mg (has no administration in time range)  morphine 4 MG/ML injection 4 mg (4 mg Intravenous Given 04/28/19 0252)  ondansetron (ZOFRAN) injection 4 mg (4 mg Intravenous Given 04/28/19 0250)  ketorolac (TORADOL) 30 MG/ML injection 15 mg (15 mg Intravenous Given 04/28/19 0326)  morphine 4 MG/ML injection 4 mg (4 mg Intravenous Given 04/28/19 0509)    Mobility walks Low fall risk   Focused Assessments na   R Recommendations: See Admitting Provider Note  Report given to:   Additional Notes:  Only speaks a little [place]

## 2019-04-28 NOTE — Anesthesia Postprocedure Evaluation (Signed)
Anesthesia Post Note  Patient: Jesse Maynard  Procedure(s) Performed: LAPAROSCOPIC CHOLECYSTECTOMY (N/A )  Patient location during evaluation: PACU Anesthesia Type: General Level of consciousness: awake and alert Pain management: pain level controlled Vital Signs Assessment: post-procedure vital signs reviewed and stable Respiratory status: spontaneous breathing and respiratory function stable Cardiovascular status: stable Anesthetic complications: no     Last Vitals:  Vitals:   04/28/19 1945 04/28/19 2000  BP: 116/73 105/69  Pulse:    Resp: 15   Temp: 37.2 C   SpO2:      Last Pain:  Vitals:   04/28/19 2000  TempSrc:   PainSc: Asleep                 KEPHART,WILLIAM K

## 2019-04-28 NOTE — Transfer of Care (Signed)
Immediate Anesthesia Transfer of Care Note  Patient: Jesse Maynard  Procedure(s) Performed: LAPAROSCOPIC CHOLECYSTECTOMY (N/A )  Patient Location: PACU  Anesthesia Type:General  Level of Consciousness: drowsy  Airway & Oxygen Therapy: Patient Spontanous Breathing and Patient connected to face mask oxygen  Post-op Assessment: Report given to RN and Post -op Vital signs reviewed and stable  Post vital signs: Reviewed and stable  Last Vitals:  Vitals Value Taken Time  BP    Temp    Pulse 96 04/28/19 1945  Resp 15 04/28/19 1945  SpO2 100 % 04/28/19 1945  Vitals shown include unvalidated device data.  Last Pain:  Vitals:   04/28/19 1506  TempSrc:   PainSc: Asleep         Complications: No apparent anesthesia complications

## 2019-04-28 NOTE — ED Notes (Addendum)
Information via [employer] interpreter [name] [phone] obtained for assessment. Pt reports upper mid abd pain that started around 8 pm. Seen here several days ago for the same and told possibly reflux. Pt reports +nausea but no vomiting. Last BM today and was normal.

## 2019-04-28 NOTE — Anesthesia Procedure Notes (Signed)
Procedure Name: Intubation Date/Time: 04/28/2019 5:02 PM Performed by: Esaw Grandchild, CRNA Pre-anesthesia Checklist: Patient identified, Emergency Drugs available, Suction available and Patient being monitored Patient Re-evaluated:Patient Re-evaluated prior to induction Oxygen Delivery Method: Circle system utilized Preoxygenation: Pre-oxygenation with 100% oxygen Induction Type: IV induction Ventilation: Two handed mask ventilation required Laryngoscope Size: Miller and 2 Grade View: Grade I Tube type: Oral Tube size: 7.5 mm Number of attempts: 1 Airway Equipment and Method: Stylet Placement Confirmation: ETT inserted through vocal cords under direct vision,  positive ETCO2 and breath sounds checked- equal and bilateral Secured at: 24 cm Tube secured with: Tape Dental Injury: Teeth and Oropharynx as per pre-operative assessment

## 2019-04-28 NOTE — Anesthesia Preprocedure Evaluation (Signed)
Anesthesia Evaluation  Patient identified by MRN, date of birth, ID band Patient awake    Reviewed: Allergy & Precautions, NPO status , Patient's Chart, lab work & pertinent test results  History of Anesthesia Complications Negative for: history of anesthetic complications  Airway Mallampati: III       Dental   Pulmonary neg sleep apnea, neg COPD, Not current smoker,           Cardiovascular (-) hypertension(-) Past MI and (-) CHF (-) dysrhythmias (-) Valvular Problems/Murmurs     Neuro/Psych neg Seizures    GI/Hepatic Neg liver ROS, neg GERD  ,  Endo/Other  neg diabetes  Renal/GU Renal disease (stones)     Musculoskeletal   Abdominal   Peds  Hematology   Anesthesia Other Findings   Reproductive/Obstetrics                             Anesthesia Physical Anesthesia Plan  ASA: II and emergent  Anesthesia Plan: General   Post-op Pain Management:    Induction: Intravenous  PONV Risk Score and Plan: 2 and Dexamethasone and Ondansetron  Airway Management Planned: Oral ETT  Additional Equipment:   Intra-op Plan:   Post-operative Plan:   Informed Consent: I have reviewed the patients History and Physical, chart, labs and discussed the procedure including the risks, benefits and alternatives for the proposed anesthesia with the patient or authorized representative who has indicated his/her understanding and acceptance.       Plan Discussed with:   Anesthesia Plan Comments:         Anesthesia Quick Evaluation

## 2019-04-28 NOTE — H&P (Signed)
CC: Abdominal pain  HPI: Jesse Maynard is a 38 year old man who presented to the emergency department about a week ago with epigastric abdominal pain.  No clear etiology was identified at that time; he was ruled out for cardiac source.  He returned to the emergency department last night stating that the pain returned on Wednesday.  It is primarily in the epigastrium and right upper quadrant and radiates to his back.  He denies any nausea or vomiting.  No fevers or chills.  He denies any jaundice or diarrhea.  Nothing seems to make the pain better or worse.  General surgery was contacted by the emergency department when a right upper quadrant ultrasound was suggestive of acute cholecystitis.   History reviewed. No pertinent past medical history. History reviewed. No pertinent surgical history. No family history on file. Social History   Tobacco Use  . Smoking status: Never Smoker  . Smokeless tobacco: Never Used  Substance Use Topics  . Alcohol use: No  . Drug use: Never   No outpatient medications have been marked as taking for the 04/28/19 encounter [facility] Encounter).   No Known Allergies  Review of Systems  Constitutional: Negative for chills, fever and weight loss.  HENT: Negative.   Eyes: Negative.   Respiratory: Negative.   Cardiovascular: Negative.   Gastrointestinal: Positive for abdominal pain. Negative for diarrhea, nausea and vomiting.  Genitourinary: Negative.   Musculoskeletal: Negative.   Neurological: Negative.   Endo/Heme/Allergies: Negative.   Psychiatric/Behavioral: Negative.    Vitals:   04/28/19 0605 04/28/19 0756  BP: 121/74 118/78  Pulse: (!) 56 60  Resp: 20 18  Temp: 98 F (36.7 C) 98.1 F (36.7 C)  SpO2: 100% 100%   Physical Exam  Constitutional: He is oriented to person, place, and time. He appears well-developed and well-nourished. No distress.  HENT:  Head: Normocephalic and atraumatic.  Nose and mouth are covered with a mask secondary to  COVID-19 precautions  Eyes: No scleral icterus.  Neck: No tracheal deviation present.  Cardiovascular: Normal rate and regular rhythm.  Pulmonary/Chest: Effort normal. No stridor.  Abdominal: Soft. He exhibits no distension. There is abdominal tenderness.  Tender to palpation in the right upper quadrant.  No peritoneal signs.  Genitourinary:    Genitourinary Comments: Deferred   Musculoskeletal:        General: No edema.  Neurological: He is alert and oriented to person, place, and time.  Skin: Skin is warm and dry.  Psychiatric: He has a normal mood and affect. His behavior is normal. Thought content normal.    Labs: Results for CHANDAN, FLY (MRN [id number]) as of 04/28/2019 10:26  Ref. Range 04/28/2019 02:21  COMPREHENSIVE METABOLIC PANEL Unknown Rpt (A)  Sodium Latest Ref Range: 135 - 145 mmol/L 138  Potassium Latest Ref Range: 3.5 - 5.1 mmol/L 3.6  Chloride Latest Ref Range: 98 - 111 mmol/L 102  CO2 Latest Ref Range: 22 - 32 mmol/L 25  Glucose Latest Ref Range: 70 - 99 mg/dL 132 (H)  BUN Latest Ref Range: 6 - 20 mg/dL 13  Creatinine Latest Ref Range: 0.61 - 1.24 mg/dL 0.76  Calcium Latest Ref Range: 8.9 - 10.3 mg/dL 8.9  Anion gap Latest Ref Range: 5 - 15  11  Alkaline Phosphatase Latest Ref Range: 38 - 126 U/L 84  Albumin Latest Ref Range: 3.5 - 5.0 g/dL 4.4  Lipase Latest Ref Range: 11 - 51 U/L 28  AST Latest Ref Range: 15 - 41 U/L 33  ALT  Latest Ref Range: 0 - 44 U/L 43  Total Protein Latest Ref Range: 6.5 - 8.1 g/dL 8.0  Total Bilirubin Latest Ref Range: 0.3 - 1.2 mg/dL 1.0  GFR, Est Non African American Latest Ref Range: >60 mL/min >60  GFR, Est African American Latest Ref Range: >60 mL/min >60  WBC Latest Ref Range: 4.0 - 10.5 K/uL 11.1 (H)  RBC Latest Ref Range: 4.22 - 5.81 MIL/uL 4.96  Hemoglobin Latest Ref Range: 13.0 - 17.0 g/dL 16.115.1  HCT Latest Ref Range: 39.0 - 52.0 % 44.2  MCV Latest Ref Range: 80.0 - 100.0 fL 89.1  MCH Latest Ref Range: 26.0 - 34.0  pg 30.4  MCHC Latest Ref Range: 30.0 - 36.0 g/dL 09.634.2  RDW Latest Ref Range: 11.5 - 15.5 % 12.3  Platelets Latest Ref Range: 150 - 400 K/uL 333  nRBC Latest Ref Range: 0.0 - 0.2 % 0.0    Imaging: CLINICAL DATA:  Epigastric abdominal pain and nausea.  EXAM: ULTRASOUND ABDOMEN LIMITED RIGHT UPPER QUADRANT  COMPARISON:  09/04/2018 CT abdomen/pelvis  FINDINGS: Gallbladder:  Gallbladder nearly filled with layering sludge. No shadowing gallstones demonstrated. Borderline mild diffuse gallbladder wall thickening. Trace pericholecystic fluid. Sonographic Eulah PontMurphy sign is present.  Common bile duct:  Diameter: 5 mm  Liver:  No focal lesion identified. Within normal limits in parenchymal echogenicity. Portal vein is patent on color Doppler imaging with normal direction of blood flow towards the liver.  Other: None.  IMPRESSION: 1. Gallbladder nearly filled with sludge. No cholelithiasis detected. Mild diffuse gallbladder wall thickening. Trace pericholecystic fluid. Sonographic Murphy sign present. Acute cholecystitis cannot be excluded. 2. No biliary ductal dilatation. 3. Normal liver.  Impression and plan: This is a 38 year old man who is had epigastric and right upper quadrant pain for about a week.  It abated initially, but returned on Wednesday of this week.  He has an elevated white blood cell count as well as some findings on his ultrasound suggesting acute cholecystitis.  There is no evidence of biliary obstruction.  He will be admitted to the general surgery service.  We will make him n.p.o. and start IV fluids.  Antibiotics will be administered.  We will schedule him for laparoscopic cholecystectomy pending OR and anesthesia availability.

## 2019-04-28 NOTE — Progress Notes (Signed)
Spanish interpreter at bedside, surgical consent obtained.

## 2019-04-28 NOTE — Op Note (Signed)
Laparoscopic Cholecystectomy  Pre-operative Diagnosis: Acute cholecystitis  Post-operative Diagnosis: Same  Procedure: Laparoscopic cholecystectomy  Surgeon: Fredirick Maudlin, MD  Anesthesia: GETA  Assistant: None   Findings: Extensive adhesions and inflammation surrounding the gallbladder.  The gallbladder itself was quite distended and tense, consistent with the clinical history of cholecystitis.  Estimated Blood Loss: 400 cc         Drains: None         Specimens: Gallbladder           Complications: none   Procedure Details  The patient was seen again in the preoperative holding area. The benefits, complications, treatment options, and expected outcomes were discussed with the patient. The risks of bleeding, infection, recurrence of symptoms, failure to resolve symptoms, bile duct damage, bile duct leak, retained common bile duct stone, bowel injury, any of which could require further surgery and/or ERCP, stent, or papillotomy were reviewed with the patient. The likelihood of improving the patient's symptoms with return to their baseline status is good.  The patient and/or family concurred with the proposed plan, giving informed consent.  The patient was taken to operating room, identified as [place] and the procedure verified as Laparoscopic Cholecystectomy. A time out was performed and the above information confirmed.  Prior to the induction of general anesthesia, antibiotic prophylaxis was administered. VTE prophylaxis was in place. General endotracheal anesthesia was then administered and tolerated well. After the induction, the abdomen was prepped with Chloraprep and draped in the sterile fashion. The patient was positioned in the supine position.    Optiview technique was used to enter the abdomen in the right upper quadrant via a 5 mm port. Pneumoperitoneum was then created with CO2 and tolerated well without any adverse changes in the patient's vital signs.   A 12 mm periumbilical port and 2 additional 5-mm ports were placed in the right upper quadrant all under direct vision. All skin incisions  were infiltrated with a local anesthetic agent before making the incision and placing the trocars.   The patient was positioned  in reverse Trendelenburg, tilted slightly to the patient's left.  The gallbladder was identified, however there were extensive adhesions surrounding it.  Approximately 45 minutes of dissection ensued to free the gallbladder from the adhesions.  All of the tissues were quite inflamed and friable, resulting in a moderate amount of oozing from every surface that was manipulated.  The gallbladder was extremely tense and unable to be grasped to retract the fundus.  A trocar was used to puncture the gallbladder and aspirated approximately 50 cc of dark green bile.  The fundus was then able to be grasped and retracted cephalad.  Additional dhesions were lysed bluntly.  The patient's stomach was full of gas.  The anesthesia provider placed an orogastric tube, however it failed to decompress the stomach.  This made visualization extremely difficult throughout the case.  The infundibulum was grasped and retracted laterally, exposing the peritoneum overlying the triangle of Calot.  The peritoneum was quite thick and fatty, and as this was being divided, the cystic artery partially avulsed, which resulted in significant bleeding.  This was managed with application of Surgicel and direct pressure.  I was able to dissect out the cystic duct and free the remainder of the fatty, inflamed peritoneum so that an extended critical view of the cystic duct and cystic artery was obtained.  The cystic duct was clearly identified and bluntly dissected free. Both the cystic artery and duct were double  clipped and divided.  This resolved the bleeding.  There was a second small vessel just posterior to the cystic artery that was treated similarly.  The gallbladder was taken  from the gallbladder fossa in a retrograde fashion with the electrocautery. The gallbladder was removed and placed in an End pouch bag. The liver bed was irrigated and inspected. Hemostasis was achieved with the electrocautery. Copious saline irrigation was utilized and was repeatedly aspirated until clear.  The gallbladder and Endo pouch sac were then removed through a port site.   Inspection of the right upper quadrant was performed. No bleeding, bile duct injury or leak, or bowel injury was noted. Pneumoperitoneum was released.  The periumbilical port site was closed with interrumpted 0 Vicryl sutures. 4-0 subcuticular Monocryl was used to close the skin. Dermabond was applied.  The patient was then extubated and brought to the recovery room in stable condition. Sponge, lap, and needle counts were correct at closure and at the conclusion of the case.               Duanne GuessJennifer Emerie Vanderkolk, MD, FACS

## 2019-04-28 NOTE — ED Provider Notes (Signed)
[facility] Emergency Department Provider Note  ____________________________________________   First MD Initiated Contact with Patient 04/28/19 0240     (approximate)  I have reviewed the triage vital signs and the nursing notes.   HISTORY  Chief Complaint Abdominal Pain  The patient and/or family speak(s) Spanish.  They understand they have the right to the use of a hospital interpreter, however at this time they prefer to speak directly with me in [place].  They know that they can ask for an interpreter at any time.   HPI Jesse Maynard is a 38 y.o. male with no chronic medical issues who presents for evaluation of acute onset and severe epigastric pain with associated nausea.  He reports that he last ate dinner at about 8:00 PM and then he awoke from sleep at 11:00 PM with severe pain.  He has had no vomiting but some persistent nausea.  He said the pain is very severe and radiating to his back and nothing in particular makes it better or worse.  He describes it as both sharp and burning.  He was seen in this emergency department  about a week ago for similar symptoms but was having vomiting at that time and was diagnosed with gastritis.  His work-up was reassuring at that time and he felt much better after symptomatic treatment for gastritis.  The patient denies sore throat, chest pain, cough, dysuria, and any known contact with COVID-19 patients.        History reviewed. No pertinent past medical history.  Patient Active Problem List   Diagnosis Date Noted   Cholecystitis, acute 04/28/2019    History reviewed. No pertinent surgical history.  Prior to Admission medications   Medication Sig Start Date End Date Taking? Authorizing Provider  famotidine (PEPCID) 40 MG tablet Take 1 tablet (40 mg total) by mouth every evening. 04/22/19 04/21/20  Rudene Re, MD    Allergies Patient has no known allergies.  No family history on  file.  Social History Social History   Tobacco Use   Smoking status: Never Smoker   Smokeless tobacco: Never Used  Substance Use Topics   Alcohol use: No   Drug use: Never    Review of Systems Constitutional: No fever/chills Eyes: No visual changes. ENT: No sore throat. Cardiovascular: Denies chest pain. Respiratory: Denies shortness of breath. Gastrointestinal: Epigastric pain with nausea, no vomiting. Genitourinary: Negative for dysuria. Musculoskeletal: Negative for neck pain.  Negative for back pain. Integumentary: Negative for rash. Neurological: Negative for headaches, focal weakness or numbness.   ____________________________________________   PHYSICAL EXAM:  VITAL SIGNS: ED Triage Vitals  Enc Vitals Group     BP 04/28/19 0212 104/78     Pulse Rate 04/28/19 0212 67     Resp 04/28/19 0212 20     Temp 04/28/19 0212 98.4 F (36.9 C)     Temp Source 04/28/19 0212 Oral     SpO2 04/28/19 0212 99 %     Weight 04/28/19 0210 81.6 kg (180 lb)     Height 04/28/19 0210 1.676 m (5\' 6" )     Head Circumference --      Peak Flow --      Pain Score 04/28/19 0210 10     Pain Loc --      Pain Edu? --      Excl. in [place]? --     Constitutional: Alert and oriented.  Appears to be in no significant amount of pain at this time.  Eyes: Conjunctivae are normal.  Head: Atraumatic. Nose: No congestion/rhinnorhea. Mouth/Throat: Mucous membranes are moist. Neck: No stridor.  No meningeal signs.   Cardiovascular: Normal rate, regular rhythm. Good peripheral circulation. Grossly normal heart sounds. Respiratory: Normal respiratory effort.  No retractions. Gastrointestinal: Soft with severe tenderness to palpation of the epigastrium.  Less tenderness of the right upper quadrant, equivocal Murphy sign.  No lower abdominal tenderness. Musculoskeletal: No lower extremity tenderness nor edema. No gross deformities of extremities. Neurologic:  Normal speech and language. No gross focal  neurologic deficits are appreciated.  Skin:  Skin is warm, dry and intact. Psychiatric: Mood and affect are normal. Speech and behavior are normal.  ____________________________________________   LABS (all labs ordered are listed, but only abnormal results are displayed)  Labs Reviewed  COMPREHENSIVE METABOLIC PANEL - Abnormal; Notable for the following components:      Result Value   Glucose, Bld 132 (*)    All other components within normal limits  CBC - Abnormal; Notable for the following components:   WBC 11.1 (*)    All other components within normal limits  SARS CORONAVIRUS 2 (HOSPITAL ORDER, PERFORMED IN [facility])  LIPASE, BLOOD  URINALYSIS, COMPLETE (UACMP) WITH MICROSCOPIC  HIV ANTIBODY (ROUTINE TESTING W REFLEX)   ____________________________________________  EKG  No indication for EKG ____________________________________________  RADIOLOGY Marylou MccoyI, Franshesca Chipman, personally viewed and evaluated these images (plain radiographs) as part of my medical decision making, as well as reviewing the written report by the radiologist.  ED MD interpretation: Biliary sludge, gallbladder wall thickening, pericholecystic fluid  Official radiology report(s): [place]as Abdomen Limited Ruq  Result Date: 04/28/2019 CLINICAL DATA:  Epigastric abdominal pain and nausea. EXAM: ULTRASOUND ABDOMEN LIMITED RIGHT UPPER QUADRANT COMPARISON:  09/04/2018 CT abdomen/pelvis FINDINGS: Gallbladder: Gallbladder nearly filled with layering sludge. No shadowing gallstones demonstrated. Borderline mild diffuse gallbladder wall thickening. Trace pericholecystic fluid. Sonographic Eulah PontMurphy sign is present. Common bile duct: Diameter: 5 mm Liver: No focal lesion identified. Within normal limits in parenchymal echogenicity. Portal vein is patent on color Doppler imaging with normal direction of blood flow towards the liver. Other: None. IMPRESSION: 1. Gallbladder nearly filled with sludge. No cholelithiasis  detected. Mild diffuse gallbladder wall thickening. Trace pericholecystic fluid. Sonographic Murphy sign present. Acute cholecystitis cannot be excluded. 2. No biliary ductal dilatation. 3. Normal liver. Electronically Signed   By: Delbert PhenixJason A Poff M.D.   On: 04/28/2019 04:32    ____________________________________________   PROCEDURES   Procedure(s) performed (including Critical Care):  Procedures   ____________________________________________   INITIAL IMPRESSION / MDM / ASSESSMENT AND PLAN / ED COURSE  As part of my medical decision making, I reviewed the following data within the electronic MEDICAL RECORD NUMBER Nursing notes reviewed and incorporated, Labs reviewed , Old chart reviewed, Discussed with admitting physician (Dr. Lady Garyannon), A consult was requested and obtained from this/these consultant(s) Surgery and Notes from prior ED visits   Differential diagnosis includes, but is not limited to, biliary colic, gastritis, acid reflux.  The patient was evaluated about a week ago and diagnosed with gastritis and thought to be very low risk for ACS or other emergent medical condition and I think that is true.  However, given his recurrent and persistent symptoms and symptoms tonight that seem very consistent with biliary colic, I will evaluate with a right upper quadrant ultrasound.  I am giving morphine 4 mg IV and Zofran 4 mg IV.  I will also give Toradol 15 mg IV.  Comprehensive metabolic panel is  within normal limits with no LFT elevation.  Lipase is normal at 28.  His CBC is essentially normal with only very slightly elevated WBC of 11.1.  Patient understands and agrees with the plan.  No indication for COVID-19 testing at this time; I anticipate he will likely be discharged for outpatient follow-up but if he requires surgery then we will get the rapid swab.      Clinical Course as of Apr 27 542  Sat Apr 28, 2019  0501 The patient is still in a substantial amount of pain I will order  another morphine 4 mg IV.  Ultrasound demonstrates a gallbladder that the radiologist described is almost completely full of sludge with some pericholecystic fluid and a thickened gallbladder wall.  This likely represents acute cholecystitis given the clinical picture.  I have paged Dr. Lady Garyannon to discuss the case.   [CF]  0508 Discussed case by phone with Dr. Lady Garyannon with general surgery.  She will admit the patient and likely perform a cholecystectomy.  I have ordered the rapid COVID swab to prepare him for surgery and have ordered Zosyn 3.375 g IV as per her request.   [CF]    Clinical Course User Index [CF] Loleta RoseForbach, Taronda Comacho, MD     ____________________________________________  FINAL CLINICAL IMPRESSION(S) / ED DIAGNOSES  Final diagnoses:  Acute cholecystitis  Gallbladder sludge  Intractable abdominal pain     MEDICATIONS GIVEN DURING THIS VISIT:  Medications  piperacillin-tazobactam (ZOSYN) IVPB 3.375 g (has no administration in time range)  dextrose 5 % in lactated ringers infusion (has no administration in time range)  piperacillin-tazobactam (ZOSYN) IVPB 3.375 g (has no administration in time range)  acetaminophen (TYLENOL) tablet 1,000 mg (has no administration in time range)  ketorolac (TORADOL) 30 MG/ML injection 30 mg (has no administration in time range)  ibuprofen (ADVIL) tablet 600 mg (has no administration in time range)  HYDROmorphone (DILAUDID) injection 0.5 mg (has no administration in time range)  ondansetron (ZOFRAN-ODT) disintegrating tablet 4 mg (has no administration in time range)    Or  ondansetron (ZOFRAN) injection 4 mg (has no administration in time range)  famotidine (PEPCID) tablet 40 mg (has no administration in time range)  tamsulosin (FLOMAX) capsule 0.4 mg (has no administration in time range)  morphine 4 MG/ML injection 4 mg (4 mg Intravenous Given 04/28/19 0252)  ondansetron (ZOFRAN) injection 4 mg (4 mg Intravenous Given 04/28/19 0250)  ketorolac  (TORADOL) 30 MG/ML injection 15 mg (15 mg Intravenous Given 04/28/19 0326)  morphine 4 MG/ML injection 4 mg (4 mg Intravenous Given 04/28/19 [id number])     ED Discharge Orders    None      *Please note:  Jesse Maynard was evaluated in Emergency Department on 04/28/2019 for the symptoms described in the history of present illness. He was evaluated in the context of the global COVID-19 pandemic, which necessitated consideration that the patient might be at risk for infection with the SARS-CoV-2 virus that causes COVID-19. Institutional protocols and algorithms that pertain to the evaluation of patients at risk for COVID-19 are in a state of rapid change based on information released by regulatory bodies including the CDC and federal and state organizations. These policies and algorithms were followed during the patient's care in the ED.  Some ED evaluations and interventions may be delayed as a result of limited staffing during the pandemic.*  Note:  This document was prepared using Dragon voice recognition software and may include unintentional dictation errors.   York CeriseForbach,  Kandee Keenory, MD 04/28/19 [phone]

## 2019-04-28 NOTE — ED Triage Notes (Signed)
Using video [employer] [id number]. Patient with complaint of epigastric pain that woke him out of his sleep about 3 hours ago. Patient denies fever, shortness of breath, nausea or vomiting. Patient states that he has had some pain since Wednesday.

## 2019-04-28 NOTE — Anesthesia Post-op Follow-up Note (Signed)
Anesthesia QCDR form completed.        

## 2019-04-29 ENCOUNTER — Encounter: Payer: Self-pay | Admitting: General Surgery

## 2019-04-29 LAB — CBC
HCT: 39.5 % (ref 39.0–52.0)
Hemoglobin: 13.2 g/dL (ref 13.0–17.0)
MCH: 30.5 pg (ref 26.0–34.0)
MCHC: 33.4 g/dL (ref 30.0–36.0)
MCV: 91.2 fL (ref 80.0–100.0)
Platelets: 308 10*3/uL (ref 150–400)
RBC: 4.33 MIL/uL (ref 4.22–5.81)
RDW: 12.8 % (ref 11.5–15.5)
WBC: 13.1 10*3/uL — ABNORMAL HIGH (ref 4.0–10.5)
nRBC: 0 % (ref 0.0–0.2)

## 2019-04-29 LAB — COMPREHENSIVE METABOLIC PANEL
ALT: 58 U/L — ABNORMAL HIGH (ref 0–44)
AST: 47 U/L — ABNORMAL HIGH (ref 15–41)
Albumin: 3.6 g/dL (ref 3.5–5.0)
Alkaline Phosphatase: 73 U/L (ref 38–126)
Anion gap: 10 (ref 5–15)
BUN: 10 mg/dL (ref 6–20)
CO2: 25 mmol/L (ref 22–32)
Calcium: 8.7 mg/dL — ABNORMAL LOW (ref 8.9–10.3)
Chloride: 105 mmol/L (ref 98–111)
Creatinine, Ser: 0.59 mg/dL — ABNORMAL LOW (ref 0.61–1.24)
GFR calc Af Amer: >60 mL/min (ref >60)
GFR calc non Af Amer: >60 mL/min (ref >60)
Glucose, Bld: 125 mg/dL — ABNORMAL HIGH (ref 70–99)
Potassium: 3.9 mmol/L (ref 3.5–5.1)
Sodium: 140 mmol/L (ref 135–145)
Total Bilirubin: 0.7 mg/dL (ref 0.3–1.2)
Total Protein: 7.2 g/dL (ref 6.5–8.1)

## 2019-04-29 LAB — MAGNESIUM: Magnesium: 2.3 mg/dL (ref 1.7–2.4)

## 2019-04-29 LAB — PHOSPHORUS: Phosphorus: 3.8 mg/dL (ref 2.5–4.6)

## 2019-04-29 MED ORDER — HYDROCODONE-ACETAMINOPHEN 5-325 MG PO TABS: 1.0000 | ORAL_TABLET | Freq: Four times a day (QID) | ORAL | 0 refills | Status: DC | PRN

## 2019-04-29 MED ORDER — IBUPROFEN 800 MG PO TABS: 800.0000 mg | ORAL_TABLET | Freq: Three times a day (TID) | ORAL | 0 refills | Status: AC | PRN

## 2019-04-29 NOTE — Discharge Summary (Signed)
Physician Discharge Summary  Patient ID: Jesse Harrisonfren Gonzalez Maynard MRN: [id number] DOB/AGE: 05-17-1981 38 y.o.  Admit date: 04/28/2019 Discharge date: 04/29/2019  Admission Diagnoses: Acute cholecystitis  Discharge Diagnoses: Acute cholecystitis, status post laparoscopic cholecystectomy Active Problems:   * No active hospital problems. *   Discharged Condition: good  Hospital Course: Jesse Maynard is a 38 year old man who presented to the emergency department for the second time in a week with abdominal pain.  It started around 1 August when he made his initial ED visit.  No etiology could be identified and he was discharged home.  Several days later, he developed worsening abdominal pain and return to the emergency department approximately 3 days after the pain had resumed.  He was found to have radiographic findings and laboratory values consistent with acute cholecystitis.  Physical examination confirmed this.  He was taken to the operating room where he underwent an uncomplicated laparoscopic cholecystectomy.  Postoperatively, his pain was well controlled with an oral pain regimen.  He tolerated a diet.  He was felt to be stable for discharge to home.  Consults: None  Significant Diagnostic Studies: labs: Results for Jesse Maynard, Kedron (MRN [id number]) as of 04/29/2019 16:07  Ref. Range 04/21/2019 23:13 04/22/2019 02:38 04/28/2019 02:21 04/29/2019 06:53  Sodium Latest Ref Range: 135 - 145 mmol/L 140  138 140  Potassium Latest Ref Range: 3.5 - 5.1 mmol/L 3.5  3.6 3.9  Chloride Latest Ref Range: 98 - 111 mmol/L 105  102 105  CO2 Latest Ref Range: 22 - 32 mmol/L 25  25 25   Glucose Latest Ref Range: 70 - 99 mg/dL 782170 (H)  956132 (H) 213125 (H)  BUN Latest Ref Range: 6 - 20 mg/dL 12  13 10   Creatinine Latest Ref Range: 0.61 - 1.24 mg/dL 0.860.75  5.780.76 4.690.59 (L)  Calcium Latest Ref Range: 8.9 - 10.3 mg/dL 9.1  8.9 8.7 (L)  Anion gap Latest Ref Range: 5 - 15  10  11 10   Phosphorus Latest Ref Range: 2.5 -  4.6 mg/dL    3.8  Magnesium Latest Ref Range: 1.7 - 2.4 mg/dL    2.3  Alkaline Phosphatase Latest Ref Range: 38 - 126 U/L 117  84 73  Albumin Latest Ref Range: 3.5 - 5.0 g/dL 4.3  4.4 3.6  Lipase Latest Ref Range: 11 - 51 U/L 39  28   AST Latest Ref Range: 15 - 41 U/L 34  33 47 (H)  ALT Latest Ref Range: 0 - 44 U/L 56 (H)  43 58 (H)  Total Protein Latest Ref Range: 6.5 - 8.1 g/dL 8.0  8.0 7.2  Total Bilirubin Latest Ref Range: 0.3 - 1.2 mg/dL 0.4  1.0 0.7  GFR, Est Non African American Latest Ref Range: >60 mL/min >60  >60 >60  GFR, Est African American Latest Ref Range: >60 mL/min >60  >60 >60  Troponin I (High Sensitivity) Latest Ref Range: <18 ng/L <2 <2    WBC Latest Ref Range: 4.0 - 10.5 K/uL 7.2  11.1 (H) 13.1 (H)  RBC Latest Ref Range: 4.22 - 5.81 MIL/uL 5.09  4.96 4.33  Hemoglobin Latest Ref Range: 13.0 - 17.0 g/dL 62.915.6  52.815.1 41.313.2  HCT Latest Ref Range: 39.0 - 52.0 % 45.1  44.2 39.5  MCV Latest Ref Range: 80.0 - 100.0 fL 88.6  89.1 91.2  MCH Latest Ref Range: 26.0 - 34.0 pg 30.6  30.4 30.5  MCHC Latest Ref Range: 30.0 - 36.0 g/dL 24.434.6  01.034.2 33.4  RDW Latest Ref Range: 11.5 - 15.5 % 12.1  12.3 12.8  Platelets Latest Ref Range: 150 - 400 K/uL 365  333 308  nRBC Latest Ref Range: 0.0 - 0.2 % 0.0  0.0 0.0    and radiology: Ultrasound: CLINICAL DATA:  Epigastric abdominal pain and nausea.  Treatments: IV hydration, antibiotics: Zosyn, analgesia: acetaminophen, Dilaudid and Toradol and surgery: lap chole  Discharge Exam: Blood pressure 104/70, pulse (!) 59, temperature 97.9 F (36.6 C), temperature source Oral, resp. rate 20, height 5\' 6"  (1.676 m), weight 81.6 kg, SpO2 97 %. General appearance: alert, cooperative and no distress Resp: clear to auscultation bilaterally Cardio: regular rate and rhythm GI: soft, appropriately tender Incision/Wound:no erythema, induration, or drainage.  Disposition: Discharge disposition: 01-Home or Self Care       Discharge Instructions     Call MD for:  difficulty breathing, headache or visual disturbances   Complete by: As directed    Call MD for:  extreme fatigue   Complete by: As directed    Call MD for:  hives   Complete by: As directed    Call MD for:  persistant dizziness or light-headedness   Complete by: As directed    Call MD for:  persistant nausea and vomiting   Complete by: As directed    Call MD for:  redness, tenderness, or signs of infection (pain, swelling, redness, odor or green/yellow discharge around incision site)   Complete by: As directed    Call MD for:  severe uncontrolled pain   Complete by: As directed    Call MD for:  temperature >100.4   Complete by: As directed    Diet general   Complete by: As directed    Low fat   Driving Restrictions   Complete by: As directed    No driving for 1 week or while taking narcotic pain medications.   Increase activity slowly   Complete by: As directed    Leave dressing on - Keep it clean, dry, and intact until clinic visit   Complete by: As directed    You may take a shower starting on Monday.  Do not allow the incisions to become saturated with water, but if they get damp, just pat them dry.  Leave the steri strips (paper bandages) on until they fall off on their own.   Lifting restrictions   Complete by: As directed    No lifting heavier than 10 lbs for 3 weeks.     Allergies as of 04/29/2019   No Known Allergies     Medication List    TAKE these medications   famotidine 40 MG tablet Commonly known as: PEPCID Take 1 tablet (40 mg total) by mouth every evening.   HYDROcodone-acetaminophen 5-325 MG tablet Commonly known as: NORCO/VICODIN Take 1 tablet by mouth every 6 (six) hours as needed for moderate pain.   ibuprofen 800 MG tablet Commonly known as: ADVIL Take 1 tablet (800 mg total) by mouth every 8 (eight) hours as needed.      Follow-up Information    Fredirick Maudlin, MD. Schedule an appointment as soon as possible for a visit in 2  week(s).   Specialty: General Surgery Contact information: [address] [place] [address] [phone]           Signed: Fredirick Maudlin 04/29/2019, 4:04 PM

## 2019-04-29 NOTE — Progress Notes (Signed)
DISCHARGE NOTE:  Spanish Interpreter at the bedside, Pt given discharge instructions and work note. Pt verbalized understanding, all questions answered . Pt wheeled to car by staff.

## 2019-04-29 NOTE — Final Progress Note (Signed)
  April 29, 2019  Patient: Jesse Maynard  Date of Birth: 1981-01-10  Date of Visit: 04/28/2019    To Whom It May Concern:  Jesse Maynard was seen and treated in our hospital and underwent surgery on 04/28/2019. Jesse Maynard  may return to work on 05/07/2019 for light duty, but may not lift anything heavier than 10 lbs for a total fo 3 weeks after his surgery, which would be 05/19/2019.Marland Kitchen  Sincerely,   Fredirick Maudlin, MD FACS General and Endocrine Surgery [facility] [address] [place], [place] [address] [phone]

## 2019-04-30 LAB — HIV ANTIBODY (ROUTINE TESTING W REFLEX): HIV Screen 4th Generation wRfx: NONREACTIVE

## 2019-05-01 LAB — SURGICAL PATHOLOGY

## 2019-05-10 ENCOUNTER — Other Ambulatory Visit: Payer: Self-pay

## 2019-05-10 ENCOUNTER — Encounter: Payer: Self-pay | Admitting: General Surgery

## 2019-05-10 ENCOUNTER — Ambulatory Visit (INDEPENDENT_AMBULATORY_CARE_PROVIDER_SITE_OTHER): Payer: Self-pay | Admitting: General Surgery

## 2019-05-10 VITALS — BP 134/86 | HR 79 | Temp 97.8°F | Ht 66.0 in | Wt 173.0 lb

## 2019-05-10 DIAGNOSIS — Z9049 Acquired absence of other specified parts of digestive tract: Secondary | ICD-10-CM

## 2019-05-10 MED ORDER — CYCLOBENZAPRINE HCL 5 MG PO TABS: 5.0000 mg | ORAL_TABLET | Freq: Every day | ORAL | 0 refills | Status: AC

## 2019-05-10 NOTE — Progress Notes (Signed)
Jesse Maynard is here today for a postoperative visit after undergoing a laparoscopic cholecystectomy on April 28, 2019.  Today's visit is conducted with the assistance of a Spanish language interpreter.   He states that he was doing well after his operation, but on Monday of this week, he began experiencing sharp pain in his right abdomen, just lateral to the umbilicus.  He states that no specific activity seems to make it worse, but the pain medication that he was prescribed does seem to make it better.  He says that it is superficial, and the muscle area, rather than deep.  Sometimes, he says, it is cramping or spasmodic in nature.  He has not experienced any nausea or vomiting related to this.  No fevers or chills, nor diarrhea.  Today's Vitals   05/10/19 1028  BP: 134/86  Pulse: 79  Temp: 97.8 F (36.6 C)  TempSrc: Skin  SpO2: 98%  Weight: 173 lb (78.5 kg)  Height: 5\' 6"  (1.676 m)   Body mass index is 27.92 kg/m. Focused abdominal exam: All of the trocar sites still have their Steri-Strips in place.  There is no surrounding erythema, induration, or drainage.  His abdomen is nontender, with the exception of tenderness to palpation just to the right side of his umbilicus.  There is no rebound or guarding present.  Impression and plan: This is a 38 year old man who underwent a cholecystectomy for acute cholecystitis.  He has been doing well, up until Monday of this week, when he began experiencing the pain described above.  This sounds like he may be experiencing muscle spasms.  I have recommended that he take his ibuprofen that was prescribed every 8 hours on a scheduled basis, rather than taking it as needed for pain.  I have also prescribed Flexeril.  I would like to see him in about a week to follow-up on his symptoms.  A work note was provided for him today, releasing him back to work on September 7, provided that he is doing well at her visit next week.

## 2019-05-10 NOTE — Patient Instructions (Addendum)
Patient to take ibuprofen every 8 hours. Take hydrocodone-acetaminophen as needed. May shower. Return ion 10 days.

## 2019-05-17 ENCOUNTER — Encounter: Payer: Self-pay | Admitting: General Surgery

## 2019-05-17 ENCOUNTER — Ambulatory Visit (INDEPENDENT_AMBULATORY_CARE_PROVIDER_SITE_OTHER): Payer: Self-pay | Admitting: General Surgery

## 2019-05-17 ENCOUNTER — Other Ambulatory Visit: Payer: Self-pay

## 2019-05-17 VITALS — BP 122/80 | Temp 97.7°F | Ht 66.0 in | Wt 176.6 lb

## 2019-05-17 DIAGNOSIS — Z9049 Acquired absence of other specified parts of digestive tract: Secondary | ICD-10-CM

## 2019-05-17 MED ORDER — AMOXICILLIN-POT CLAVULANATE 875-125 MG PO TABS: 1.0000 | ORAL_TABLET | Freq: Two times a day (BID) | ORAL | 0 refills | Status: AC

## 2019-05-17 NOTE — Patient Instructions (Signed)
Follow-up with our office as needed.  Please call and ask to speak with a nurse if you develop questions or concerns.   siga aqu segn sea necesario

## 2019-05-17 NOTE — Progress Notes (Signed)
Mr. Jesse Maynard is here today for a postoperative visit.  He underwent a laparoscopic cholecystectomy on 28 April 2019 for acute cholecystitis.  I saw him a week ago, at which time he was endorsing significant musculoskeletal pain just to the right of the umbilicus.  He was placed on nonsteroidal anti-inflammatories and given a prescription for Flexeril.  He is here today for a recheck.  He states that he has been doing better, and that his pain is markedly improved.  He has been back to work and says that he does have a little bit of chest pain.  He denies any fevers or chills.  No nausea or vomiting.  His appetite is normal and he is having normal bowel movements.  Today's Vitals   05/17/19 1514  BP: 122/80  Temp: 97.7 F (36.5 C)  SpO2: 98%  Weight: 176 lb 9.6 oz (80.1 kg)  Height: 5\' 6"  (1.676 m)   Body mass index is 28.5 kg/m. Chest: The discomfort that he endorses is reproducible with pressure and palpation, suggesting a musculoskeletal source. Abdomen: The port sites still have there are Steri-Strips in place.  These were removed.  There was pus present at the right lateral trocar site.  The remaining 3 sites had no erythema, induration, or purulent drainage.  Impression and plan: Jesse Maynard is a 37 year old man who is now 3 weeks out from a laparoscopic cholecystectomy.  He is doing well from a surgical standpoint.  He has a local wound infection at 1 of his port sites.  I have given him a course of 5 days of Augmentin.  He should continue to shower normally and wash the area with soap and water.  He may cover it with a dressing to prevent any soilage of his close.  His chest pain seems to be musculoskeletal and I am not concerned for cardiac source.  He is already back at work, but is not doing any significant lifting or pulling or pushing.  We have provided him a note that he may resume his full activities and responsibilities at work, including lifting things heavier than 10 pounds, in 1  week's time.  If any questions or concerns arise, he is welcome to contact our office.  Otherwise, we will see him on an as-needed basis.

## 2020-08-20 IMAGING — CR DG ABDOMEN ACUTE W/ 1V CHEST
1 series · 4 of 4 positions shown · non-contrast
Comparison: None.

CLINICAL DATA: Epigastric and abdominal pain

EXAM:
DG ABDOMEN ACUTE W/ 1V CHEST

[Series 1: view not recorded · 0.14mm/px · 4 of 4 slices shown]
[im 1/4]
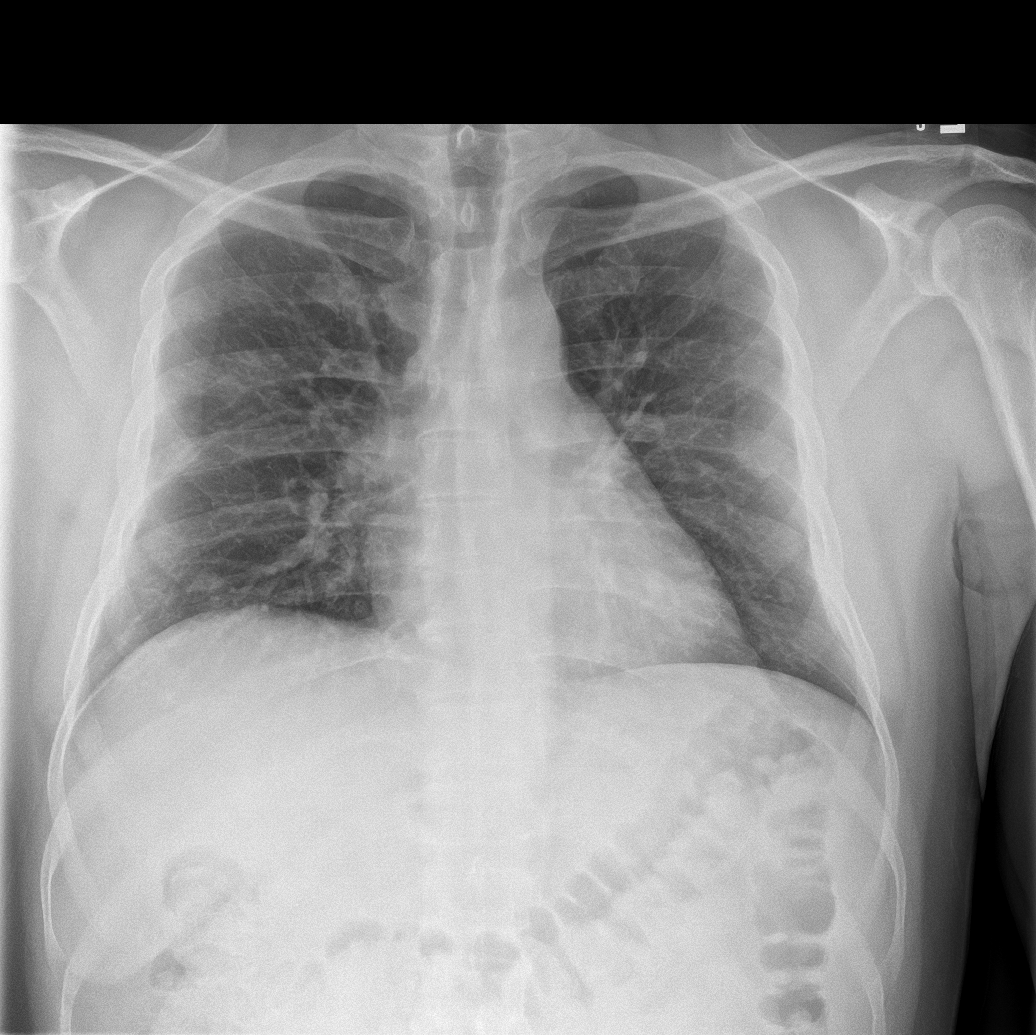
[im 2/4]
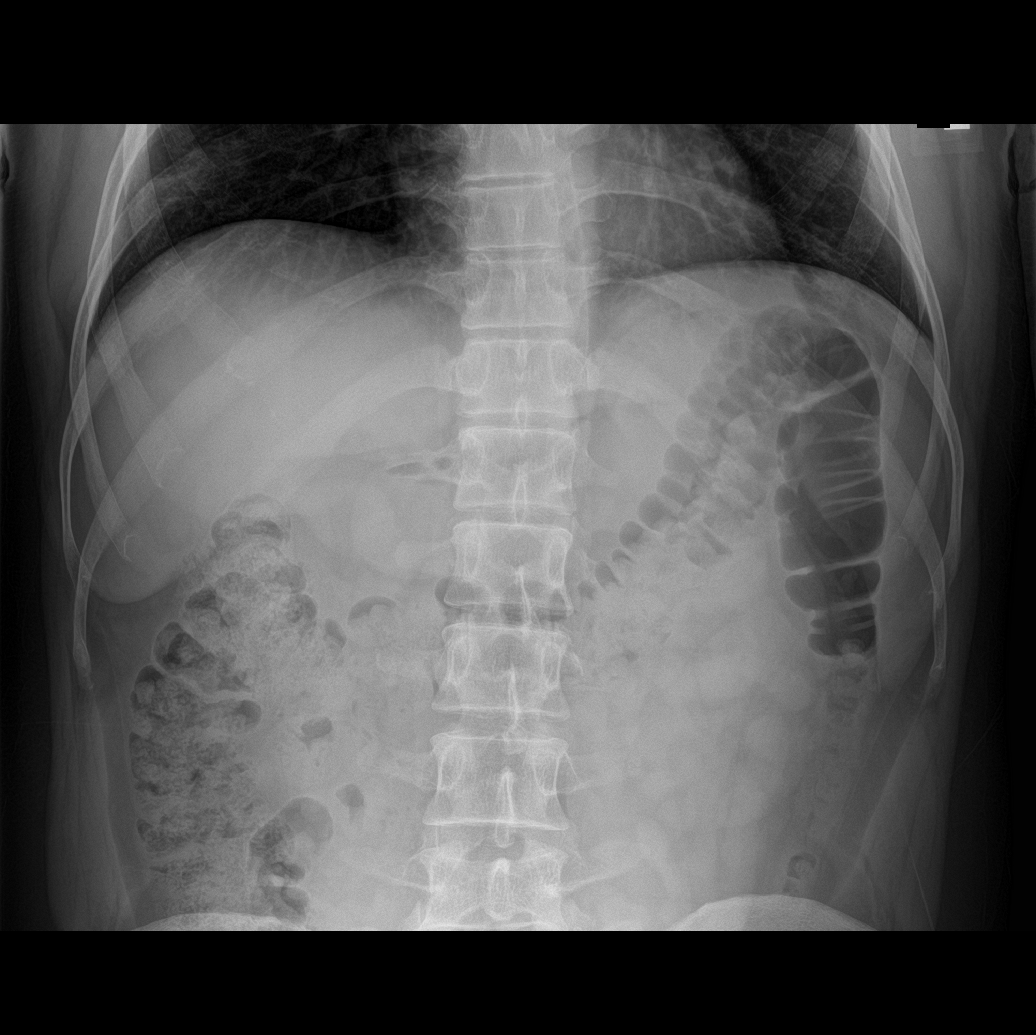
[im 3/4]
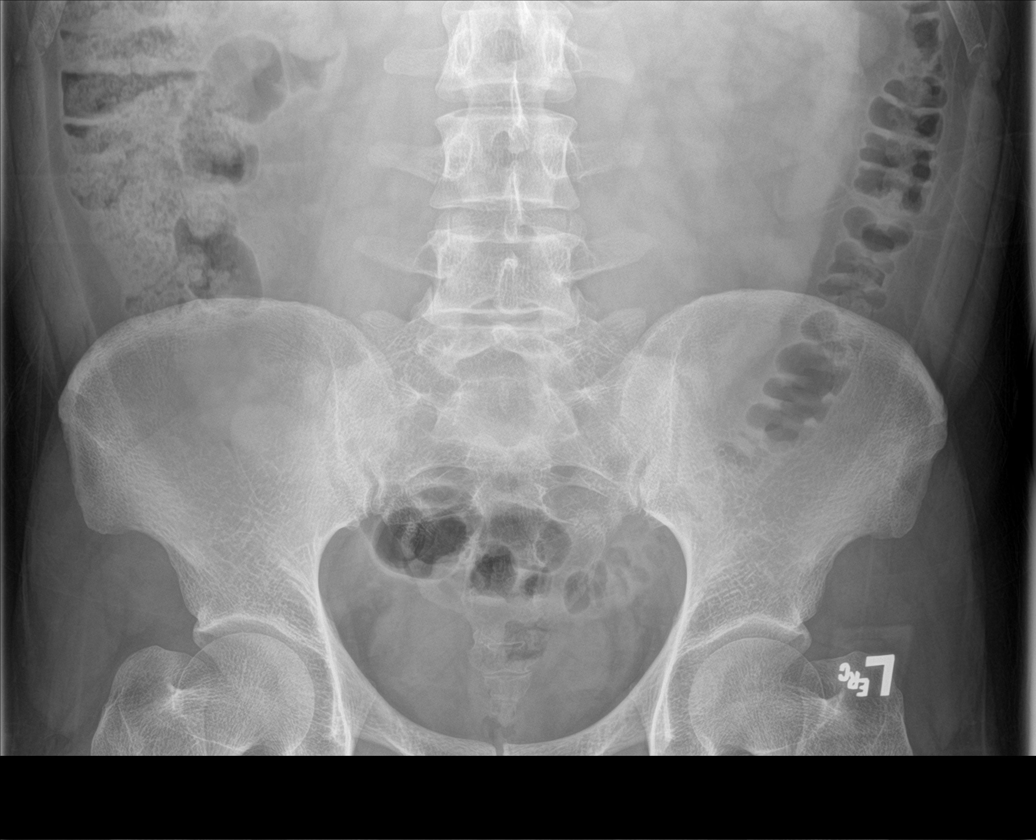
[im 4/4]
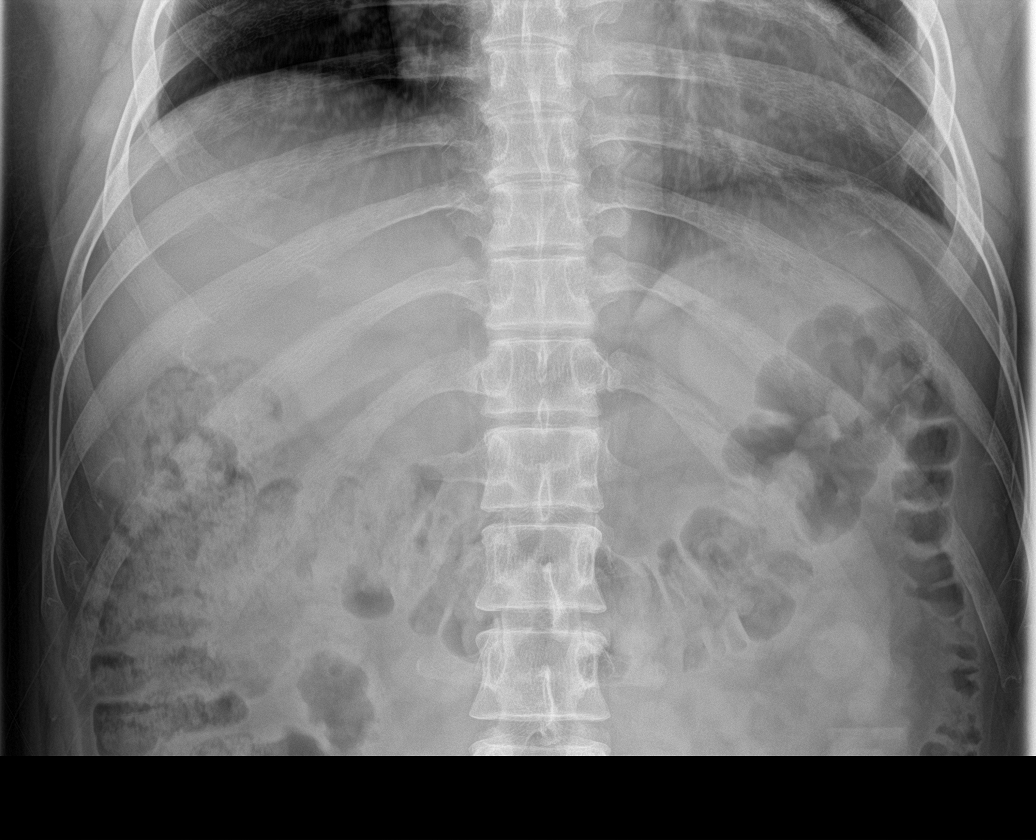

[4 of 4 positions shown; findings below may reference images not displayed]

FINDINGS: There are coarse lung markings bilaterally with possible more focal
opacities in the right mid lung zone and in the retrocardiac region.
The bowel gas pattern is nonspecific and nonobstructive. There is a
moderate amount of stool ascending colon. There is no acute osseous
abnormality.
IMPRESSION: 1. Nonobstructive bowel gas pattern.
2. Coarsened lung markings bilaterally with possible more focal
opacities in the retrocardiac region in the right mid lung zone.
Findings are nonspecific and may be secondary to an atypical
infectious process versus less likely pulmonary edema. A systemic
chronic granulomatous disease such as sarcoidosis could have a
similar appearance, however there is no definite lymphadenopathy
identified on this exam.

## 2021-06-16 ENCOUNTER — Encounter: Payer: Self-pay | Admitting: General Surgery
